# Patient Record
Sex: Female | Born: 1990 | Race: Black or African American | Hispanic: No | Marital: Single | Smoking: Former smoker
Health system: Southern US, Community
[De-identification: ages and names within clinical notes are randomized; demographics above are authoritative.]

## PROBLEM LIST (undated history)

## (undated) ENCOUNTER — Inpatient Hospital Stay (HOSPITAL_COMMUNITY): Payer: Self-pay

## (undated) DIAGNOSIS — O139 Gestational [pregnancy-induced] hypertension without significant proteinuria, unspecified trimester: Secondary | ICD-10-CM

## (undated) HISTORY — PX: NO PAST SURGERIES: SHX2092

---
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
## 2013-03-03 ENCOUNTER — Inpatient Hospital Stay (HOSPITAL_COMMUNITY)
Admission: AD | Admit: 2013-03-03 | Discharge: 2013-03-03 | Disposition: A | Discharge status: Home / Self Care | Payer: Self-pay | Source: Ambulatory Visit | Attending: Obstetrics & Gynecology | Admitting: Obstetrics & Gynecology

## 2013-03-03 ENCOUNTER — Encounter (HOSPITAL_COMMUNITY): Payer: Self-pay | Admitting: *Deleted

## 2013-03-03 DIAGNOSIS — O219 Vomiting of pregnancy, unspecified: Secondary | ICD-10-CM

## 2013-03-03 DIAGNOSIS — O210 Mild hyperemesis gravidarum: Secondary | ICD-10-CM | POA: Insufficient documentation

## 2013-03-03 LAB — URINALYSIS, ROUTINE W REFLEX MICROSCOPIC
Ketones, ur: 15 mg/dL — AB
Protein, ur: 100 mg/dL — AB
Urobilinogen, UA: 0.2 mg/dL (ref 0.0–1.0)

## 2013-03-03 LAB — POCT PREGNANCY, URINE: Preg Test, Ur: POSITIVE — AB

## 2013-03-03 LAB — URINE MICROSCOPIC-ADD ON

## 2013-03-03 MED ORDER — ONDANSETRON HCL 4 MG PO TABS: 4.0000 mg | ORAL_TABLET | Freq: Four times a day (QID) | ORAL | Status: DC

## 2013-03-03 MED ORDER — PRENATAL COMPLETE 14-0.4 MG PO TABS: 1.0000 | ORAL_TABLET | Freq: Every day | ORAL | Status: DC

## 2013-03-03 MED ORDER — ACETAMINOPHEN 500 MG PO TABS: 1000.0000 mg | ORAL_TABLET | Freq: Once | ORAL | Status: DC

## 2013-03-03 MED ORDER — PROMETHAZINE HCL 25 MG PO TABS: 25.0000 mg | ORAL_TABLET | Freq: Four times a day (QID) | ORAL | Status: DC | PRN

## 2013-03-03 MED ORDER — LACTATED RINGERS IV BOLUS (SEPSIS): 1000.0000 mL | Freq: Once | INTRAVENOUS | Status: DC

## 2013-03-03 NOTE — MAU Provider Note (Signed)
History     CSN: [id number]  Arrival date and time: 03/03/13 1055   First Provider Initiated Contact with Patient 03/03/13 1234      Chief Complaint  Patient presents with  . Fatigue  . Possible Pregnancy   HPI Ms. Kelsey Fitzgerald is a 22 y.o. G2P0 at [redacted]w[redacted]d who present to MAU today with complaint of N/V and possible pregnancy. The patient states that she had +HPT in early March. She has had N/V x 3 weeks. She denies abdominal pain, vaginal bleeding, abnormal discharge or fever. She has not started prenatal care or prenatal vitamins yet. She needs pregnancy confirmation for medicaid. LMP 01/09/13.   OB History   Grav Para Term Preterm Abortions TAB SAB Ect Mult Living   2      0         History reviewed. No pertinent past medical history.  History reviewed. No pertinent past surgical history.  History reviewed. No pertinent family history.  History  Substance Use Topics  . Smoking status: Current Every Day Smoker  . Smokeless tobacco: Not on file  . Alcohol Use: No    Allergies: No Known Allergies  No prescriptions prior to admission    Review of Systems  Constitutional: Negative for fever and malaise/fatigue.  Gastrointestinal: Positive for nausea and vomiting. Negative for abdominal pain, diarrhea and constipation.  Genitourinary: Negative for dysuria, urgency and frequency.       Neg - vaginal bleeding Neg - vaginal discharge  Neurological: Positive for headaches.   Physical Exam   Blood pressure 144/66, pulse 92, temperature 98.3 F (36.8 C), temperature source Oral, resp. rate 18, height 5\' 6"  (1.676 m), weight 128 lb (58.06 kg), last menstrual period 01/09/2013.  Physical Exam  Constitutional: She is oriented to person, place, and time. She appears well-developed and well-nourished. No distress.  HENT:  Head: Normocephalic and atraumatic.  Cardiovascular: Normal rate, regular rhythm and normal heart sounds.   Respiratory: Effort normal and breath sounds  normal. No respiratory distress.  GI: Soft. Bowel sounds are normal. She exhibits no distension and no mass. There is no tenderness. There is no rebound and no guarding.  Neurological: She is alert and oriented to person, place, and time.  Skin: Skin is warm and dry. No erythema.  Psychiatric: She has a normal mood and affect.   Results for orders placed during the hospital encounter of 03/03/13 (from the past 24 hour(s))  URINALYSIS, ROUTINE W REFLEX MICROSCOPIC     Status: Abnormal   Collection Time    03/03/13 11:30 AM      Result Value Range   Color, Urine YELLOW  YELLOW   APPearance CLOUDY (*) CLEAR   Specific Gravity, Urine >1.030 (*) 1.005 - 1.030   pH 6.0  5.0 - 8.0   Glucose, UA NEGATIVE  NEGATIVE mg/dL   Hgb urine dipstick NEGATIVE  NEGATIVE   Bilirubin Urine SMALL (*) NEGATIVE   Ketones, ur 15 (*) NEGATIVE mg/dL   Protein, ur 956 (*) NEGATIVE mg/dL   Urobilinogen, UA 0.2  0.0 - 1.0 mg/dL   Nitrite NEGATIVE  NEGATIVE   Leukocytes, UA TRACE (*) NEGATIVE  URINE MICROSCOPIC-ADD ON     Status: Abnormal   Collection Time    03/03/13 11:30 AM      Result Value Range   Squamous Epithelial / LPF MANY (*) RARE   WBC, UA 11-20  <3 WBC/hpf   Bacteria, UA FEW (*) RARE   Urine-Other MUCOUS PRESENT  POCT PREGNANCY, URINE     Status: Abnormal   Collection Time    03/03/13 11:34 AM      Result Value Range   Preg Test, Ur POSITIVE (*) NEGATIVE    MAU Course  Procedures None  MDM UA indicated dehydration. Patient is able to take in some PO. Offered IV fluids for re-hydration. Patient accepted, but then declined after unsuccessful IV stick. Patient is not currently nauseous or actively vomiting.   Assessment and Plan  A: Nausea and vomiting in pregnancy  P: Discharge home Rx for phenergan, Zofran and prenatal vitamins sent to patient's pharmacy Encouraged increased PO hydration as tolerated Patient given pregnancy confirmation letter and list of area OB  providers Patient may return to MAU as needed or if her condition were to change or worsen  Freddi Starr, PA-C  03/03/2013, 12:35 PM

## 2013-03-03 NOTE — MAU Note (Signed)
Pt presents for weakness that began this morning.  She also c/o vomiting x3weeks.  Took HPT at the beginning of March that was positive.

## 2013-03-03 NOTE — MAU Note (Signed)
Feeling real weak this morning. Has not beem seen anywhere yet with preg.  Vomiting. Sleepy.

## 2013-03-04 LAB — URINE CULTURE: Colony Count: 3000

## 2013-03-31 ENCOUNTER — Inpatient Hospital Stay (HOSPITAL_COMMUNITY)
Admission: AD | Admit: 2013-03-31 | Discharge: 2013-03-31 | Disposition: A | Discharge status: Home / Self Care | Payer: Self-pay | Source: Ambulatory Visit | Attending: Obstetrics & Gynecology | Admitting: Obstetrics & Gynecology

## 2013-03-31 ENCOUNTER — Encounter (HOSPITAL_COMMUNITY): Payer: Self-pay | Admitting: *Deleted

## 2013-03-31 DIAGNOSIS — N949 Unspecified condition associated with female genital organs and menstrual cycle: Secondary | ICD-10-CM | POA: Insufficient documentation

## 2013-03-31 DIAGNOSIS — O2390 Unspecified genitourinary tract infection in pregnancy, unspecified trimester: Secondary | ICD-10-CM

## 2013-03-31 DIAGNOSIS — O98819 Other maternal infectious and parasitic diseases complicating pregnancy, unspecified trimester: Secondary | ICD-10-CM | POA: Insufficient documentation

## 2013-03-31 DIAGNOSIS — A5901 Trichomonal vulvovaginitis: Secondary | ICD-10-CM | POA: Insufficient documentation

## 2013-03-31 LAB — WET PREP, GENITAL: Yeast Wet Prep HPF POC: NONE SEEN

## 2013-03-31 MED ORDER — ONDANSETRON 8 MG PO TBDP
8.0000 mg | ORAL_TABLET | Freq: Once | ORAL | Status: AC
  Administered 2013-03-31: 8 mg via ORAL
  Filled 2013-03-31: qty 1

## 2013-03-31 MED ORDER — METRONIDAZOLE 500 MG PO TABS
2000.0000 mg | ORAL_TABLET | Freq: Once | ORAL | Status: AC
  Administered 2013-03-31: 2000 mg via ORAL
  Filled 2013-03-31: qty 4

## 2013-03-31 NOTE — MAU Provider Note (Signed)
Attestation of Attending Supervision of Advanced Practitioner (PA/CNM/NP): Evaluation and management procedures were performed by the Advanced Practitioner under my supervision and collaboration.  I have reviewed the Advanced Practitioner's note and chart, and I agree with the management and plan.  Glorianne Proctor, MD, FACOG Attending Obstetrician & Gynecologist Faculty Practice, [facility]  

## 2013-03-31 NOTE — MAU Provider Note (Signed)
History     CSN: [id number]  Arrival date and time: 03/31/13 1316   First Provider Initiated Contact with Patient 03/31/13 1443      Chief Complaint  Patient presents with  . Vaginal Discharge   HPI Kelsey Fitzgerald is 22 y.o. G2P0010 [redacted]w[redacted]d weeks presenting with yellowish vaginal discharge with odor X 3 weeks. She says she is using a mini pad because of the amount of discharge.  She has not begun prenatal care -- waiting for insurance.    Past Medical History  Diagnosis Date  . Medical history non-contributory     Past Surgical History  Procedure Laterality Date  . No past surgeries      Family History  Problem Relation Age of Onset  . Alcohol abuse Neg Hx   . Arthritis Neg Hx   . Asthma Neg Hx   . Birth defects Neg Hx   . Cancer Neg Hx   . COPD Neg Hx   . Depression Neg Hx   . Diabetes Neg Hx   . Drug abuse Neg Hx   . Early death Neg Hx   . Hearing loss Neg Hx   . Heart disease Neg Hx   . Hyperlipidemia Neg Hx   . Hypertension Neg Hx   . Kidney disease Neg Hx   . Learning disabilities Neg Hx   . Mental illness Neg Hx   . Mental retardation Neg Hx   . Miscarriages / Stillbirths Neg Hx   . Stroke Neg Hx   . Vision loss Neg Hx     History  Substance Use Topics  . Smoking status: Former Games developer  . Smokeless tobacco: Not on file  . Alcohol Use: No    Allergies: No Known Allergies  No prescriptions prior to admission    Review of Systems  Constitutional: Negative.   Gastrointestinal: Negative for nausea, vomiting and abdominal pain.  Genitourinary: Negative for dysuria, urgency and frequency.       + for large amount of malodorous discharge  Neurological: Negative for headaches.   Physical Exam   Blood pressure 118/73, pulse 70, temperature 98.3 F (36.8 C), temperature source Oral, resp. rate 16, height 5\' 7"  (1.702 m), weight 128 lb (58.06 kg), last menstrual period 01/09/2013.  Physical Exam  Constitutional: She is oriented to person, place,  and time. She appears well-developed and well-nourished. No distress.  HENT:  Head: Normocephalic.  Neck: Normal range of motion.  Cardiovascular: Normal rate.   Respiratory: Effort normal.  GI: Soft. She exhibits no distension and no mass. There is no tenderness. There is no rebound and no guarding.  Genitourinary: There is no rash, tenderness or lesion on the right labia. There is no rash, tenderness or lesion on the left labia. Uterus is enlarged (1 cm below symphysis). Uterus is not tender. Cervix exhibits friability. Cervix exhibits no motion tenderness and no discharge. There is erythema around the vagina. No tenderness or bleeding around the vagina. Vaginal discharge (large amount of yellow discharge with odor) found.  FHR by doppler 156  Neurological: She is alert and oriented to person, place, and time.  Skin: Skin is warm and dry.  Psychiatric: She has a normal mood and affect. Her behavior is normal.   Results for orders placed during the hospital encounter of 03/31/13 (from the past 24 hour(s))  WET PREP, GENITAL     Status: Abnormal   Collection Time    03/31/13  2:45 PM      Result Value  Range   Yeast Wet Prep HPF POC NONE SEEN  NONE SEEN   Trich, Wet Prep FEW (*) NONE SEEN   Clue Cells Wet Prep HPF POC FEW (*) NONE SEEN   WBC, Wet Prep HPF POC TOO NUMEROUS TO COUNT (*) NONE SEEN   MAU Course  Procedures  GC/CHL culture to lab  MDM Discussed lab results with the patient and the option of 1 time treatment with Flagyl 2grams vs 1 tab bid X 1 week.  She would like to treat it with one dose.  Will give Zofran, crackers before Flagyl.  Patient took Zofran, tolerated crackers, juice and the Flagyl.  Ready for discharge  Assessment and Plan  A:  Vaginal discharge      Trichomonal infection       [redacted]w[redacted]d gestation with FHR 156 by doppler  P:  Flagyl given in MAU      Patient to begin prenatal care with doctor of her choice      Condoms for intercourse    KEY,EVE  M 03/31/2013, 2:44 PM

## 2013-03-31 NOTE — MAU Note (Signed)
Getting real uncomfortable with the discharge.  Has 'gotten rid of the smell" 3 times, has been douching.

## 2013-04-01 LAB — GC/CHLAMYDIA PROBE AMP: CT Probe RNA: NEGATIVE

## 2013-12-30 ENCOUNTER — Encounter (HOSPITAL_COMMUNITY): Payer: Self-pay | Admitting: Emergency Medicine

## 2013-12-30 ENCOUNTER — Emergency Department (HOSPITAL_COMMUNITY)
Admission: EM | Admit: 2013-12-30 | Discharge: 2013-12-31 | Disposition: A | Discharge status: Home / Self Care | Payer: Medicaid Other | Attending: Emergency Medicine | Admitting: Emergency Medicine

## 2013-12-30 DIAGNOSIS — Z87891 Personal history of nicotine dependence: Secondary | ICD-10-CM | POA: Insufficient documentation

## 2013-12-30 DIAGNOSIS — L02214 Cutaneous abscess of groin: Secondary | ICD-10-CM

## 2013-12-30 DIAGNOSIS — L03319 Cellulitis of trunk, unspecified: Principal | ICD-10-CM

## 2013-12-30 DIAGNOSIS — Z792 Long term (current) use of antibiotics: Secondary | ICD-10-CM | POA: Insufficient documentation

## 2013-12-30 DIAGNOSIS — L02219 Cutaneous abscess of trunk, unspecified: Secondary | ICD-10-CM | POA: Insufficient documentation

## 2013-12-30 NOTE — ED Provider Notes (Signed)
CSN: [id number]     Arrival date & time 12/30/13  2158 History   First MD Initiated Contact with Patient 12/30/13 2314     Chief Complaint  Patient presents with  . boil    (Consider location/radiation/quality/duration/timing/severity/associated sxs/prior Treatment) HPI Comments: Patient is a 23 y/o female who presents for an abscess to her R groin region x 5 days. Symptoms gradually worsening since onset. Patient has tried warm compresses, topical alcohol swabs, and topical creams without relief. Patient states pain is sharp, throbbing, and aching. It has been constant and is nonradiating. Patient states abscess began draining a few days ago. She denies fever, abdominal pain, vomiting, urinary symptoms, vaginal bleeding/discharge, red linear streaking, rectal pain, pain with defecation, numbness/tingling, and weakness.  The history is provided by the patient. No language interpreter was used.    Past Medical History  Diagnosis Date  . Medical history non-contributory    Past Surgical History  Procedure Laterality Date  . No past surgeries     Family History  Problem Relation Age of Onset  . Alcohol abuse Neg Hx   . Arthritis Neg Hx   . Asthma Neg Hx   . Birth defects Neg Hx   . Cancer Neg Hx   . COPD Neg Hx   . Depression Neg Hx   . Diabetes Neg Hx   . Drug abuse Neg Hx   . Early death Neg Hx   . Hearing loss Neg Hx   . Heart disease Neg Hx   . Hyperlipidemia Neg Hx   . Hypertension Neg Hx   . Kidney disease Neg Hx   . Learning disabilities Neg Hx   . Mental illness Neg Hx   . Mental retardation Neg Hx   . Miscarriages / Stillbirths Neg Hx   . Stroke Neg Hx   . Vision loss Neg Hx    History  Substance Use Topics  . Smoking status: Former Games developermoker  . Smokeless tobacco: Not on file  . Alcohol Use: No   OB History   Grav Para Term Preterm Abortions TAB SAB Ect Mult Living   2    1  1         Review of Systems  Constitutional: Negative for fever.  Gastrointestinal:  Negative for nausea and vomiting.  Skin: Positive for color change. Negative for pallor.  Neurological: Negative for weakness and numbness.  All other systems reviewed and are negative.    Allergies  Ibuprofen  Home Medications   Current Outpatient Rx  Name  Route  Sig  Dispense  Refill  . acetaminophen-codeine (TYLENOL #3) 300-30 MG per tablet   Oral   Take 1 tablet by mouth daily as needed for moderate pain.          . medroxyPROGESTERone (DEPO-PROVERA) 150 MG/ML injection   Intramuscular   Inject 150 mg into the muscle every 3 (three) months. Last injection December 2014         . sulfamethoxazole-trimethoprim (BACTRIM DS,SEPTRA DS) 800-160 MG per tablet   Oral   Take 1 tablet by mouth 2 (two) times daily.   14 tablet   0    BP 132/91  Pulse 100  Temp(Src) 98.3 F (36.8 C)  Resp 18  Ht 5\' 8"  (1.727 m)  Wt 135 lb (61.236 kg)  BMI 20.53 kg/m2  SpO2 99%  LMP 12/30/2013  Breastfeeding? Unknown  Physical Exam  Nursing note and vitals reviewed. Constitutional: She is oriented to person, place, and time. She  appears well-developed and well-nourished. No distress.  HENT:  Head: Normocephalic and atraumatic.  Eyes: Conjunctivae and EOM are normal. No scleral icterus.  Neck: Normal range of motion.  Pulmonary/Chest: Effort normal. No respiratory distress.  Abdominal: Hernia confirmed negative in the right inguinal area and confirmed negative in the left inguinal area.  No TTP, peritoneal signs, or guarding.  Genitourinary: Vagina normal.    There is tenderness on the right labia. There is no lesion or injury on the right labia. There is no rash, tenderness, lesion or injury on the left labia.  +abscess with TTP extending to R labia. No vaginal induration or red linear streaking. No peritoneal induration or TTP.  Musculoskeletal: Normal range of motion.  Lymphadenopathy:       Right: No inguinal adenopathy present.       Left: No inguinal adenopathy present.    Neurological: She is alert and oriented to person, place, and time.  No gross sensory deficits appreciated. Patient moves extremities without ataxia.  Skin: Skin is warm and dry. No rash noted. She is not diaphoretic. No pallor.  Psychiatric: She has a normal mood and affect. Her behavior is normal.    ED Course  Procedures (including critical care time) Labs Review Labs Reviewed - No data to display Imaging Review No results found.  EKG Interpretation   None      INCISION AND DRAINAGE Performed by: Antony Madura Consent: Verbal consent obtained. Risks and benefits: risks, benefits and alternatives were discussed Type: abscess  Body area: R inguinal  Anesthesia: local infiltration  Incision was made with a scalpel.  Local anesthetic: lidocaine 2% with epinephrine  Anesthetic total: 8 ml  Complexity: complex Blunt dissection to break up loculations  Drainage: purulent  Drainage amount: copious  Packing material: 1/4 in iodoform gauze  Patient tolerance: Patient tolerated the procedure well with no immediate complications.   MDM   1. Soft tissue abscess of inguinal region    Uncomplicated abscess. Patient well and nontoxic appearing, hemodynamically stable, and afebrile. She is neurovascularly intact in physical exam. Abdomen without peritoneal signs or tenderness. No red linear streaking. Abscess I&D'd at bedside with copious amounts of purulent drainage. Patient tolerated well. She is stable for discharge with instruction to return in 48 hours for packing removal and recheck. Have advised warm compresses and sitz baths to promote further drainage. Return precautions discussed and patient agreeable to plan with no unaddressed concerns.    Antony Madura, PA-C 12/31/13 [phone]

## 2013-12-30 NOTE — ED Notes (Signed)
The pt has a boil on her rt thigh upper for one week

## 2013-12-31 MED ORDER — SULFAMETHOXAZOLE-TRIMETHOPRIM 800-160 MG PO TABS: 1.0000 | ORAL_TABLET | Freq: Two times a day (BID) | ORAL | Status: DC

## 2013-12-31 NOTE — ED Notes (Signed)
Signature pad not working at bedside.  Pt verbalized understanding of d/c and f/u.  No additional questions by pt regarding d/c. VSS.

## 2013-12-31 NOTE — Discharge Instructions (Signed)
Recommend warm soaks 3-4 times per day to promote drainage. Take 600 mg ibuprofen every 6 hours as needed for pain control. Keep the area clean and dry and do not change initial dressing for at least 12 hours. Return to the emergency department in 48 hours for a recheck and packing removal. Take the antibiotics prescribed to you for the full course. Return sooner if symptoms worsen.  Abscess Care After An abscess (also called a boil or furuncle) is an infected area that contains a collection of pus. Signs and symptoms of an abscess include pain, tenderness, redness, or hardness, or you may feel a moveable soft area under your skin. An abscess can occur anywhere in the body. The infection may spread to surrounding tissues causing cellulitis. A cut (incision) by the surgeon was made over your abscess and the pus was drained out. Gauze may have been packed into the space to provide a drain that will allow the cavity to heal from the inside outwards. The boil may be painful for 5 to 7 days. Most people with a boil do not have high fevers. Your abscess, if seen early, may not have localized, and may not have been lanced. If not, another appointment may be required for this if it does not get better on its own or with medications. HOME CARE INSTRUCTIONS   Only take over-the-counter or prescription medicines for pain, discomfort, or fever as directed by your caregiver.  When you bathe, soak and then remove gauze or iodoform packs at least daily or as directed by your caregiver. You may then wash the wound gently with mild soapy water. Repack with gauze or do as your caregiver directs. SEEK IMMEDIATE MEDICAL CARE IF:   You develop increased pain, swelling, redness, drainage, or bleeding in the wound site.  You develop signs of generalized infection including muscle aches, chills, fever, or a general ill feeling.  An oral temperature above 102 F (38.9 C) develops, not controlled by medication. See your  caregiver for a recheck if you develop any of the symptoms described above. If medications (antibiotics) were prescribed, take them as directed. Document Released: 06/19/2005 Document Revised: 02/23/2012 Document Reviewed: 02/14/2008 [facility] Patient Information 2014 [place], [place].

## 2014-01-01 NOTE — ED Provider Notes (Signed)
Medical screening examination/treatment/procedure(s) were performed by non-physician practitioner and as supervising physician I was immediately available for consultation/collaboration.  EKG Interpretation   None         Brandt LoosenJulie Manly, MD 01/01/14 [phone]

## 2014-01-02 ENCOUNTER — Emergency Department (HOSPITAL_COMMUNITY)
Admission: EM | Admit: 2014-01-02 | Discharge: 2014-01-02 | Disposition: A | Discharge status: Home / Self Care | Payer: Medicaid Other | Attending: Emergency Medicine | Admitting: Emergency Medicine

## 2014-01-02 ENCOUNTER — Encounter (HOSPITAL_COMMUNITY): Payer: Self-pay | Admitting: Emergency Medicine

## 2014-01-02 DIAGNOSIS — Z4801 Encounter for change or removal of surgical wound dressing: Secondary | ICD-10-CM | POA: Insufficient documentation

## 2014-01-02 DIAGNOSIS — Z87891 Personal history of nicotine dependence: Secondary | ICD-10-CM | POA: Insufficient documentation

## 2014-01-02 DIAGNOSIS — Z5189 Encounter for other specified aftercare: Secondary | ICD-10-CM

## 2014-01-02 DIAGNOSIS — Z888 Allergy status to other drugs, medicaments and biological substances status: Secondary | ICD-10-CM | POA: Insufficient documentation

## 2014-01-02 NOTE — ED Notes (Signed)
Patient had abscess I & D'd a few days ago in R groin area.   Back to have rechecked.

## 2014-01-02 NOTE — Discharge Instructions (Signed)
Wound may continue to drain for the next day or so-- this is normal. Return to the ED for new or worsening symptoms including redness, increased swelling, high fevers, new abscesses, etc.

## 2014-01-02 NOTE — ED Notes (Signed)
Pt is here to have packing removed from wound in right groin.

## 2014-01-02 NOTE — ED Provider Notes (Signed)
CSN: [id number]     Arrival date & time 01/02/14  1200 History  This chart was scribed for non-physician practitioner, ,working with Junius Argyle, MD, by Karle Plumber, ED Scribe.  This patient was seen in room TR09C/TR09C and the patient's care was started at 12:25 PM.  Chief Complaint  Patient presents with  . Wound Check   The history is provided by the patient. No language interpreter was used.   HPI Comments:  Kelsey Fitzgerald is a 23 y.o. female who presents to the Emergency Department needing packing removed from an abscess of her right groin that was drained here in the ED two days ago. Pt states she is not having any issues and reports her pain is minimal at this time. She denies any fever.  No new abscesses have developed.  Past Medical History  Diagnosis Date  . Medical history non-contributory    Past Surgical History  Procedure Laterality Date  . No past surgeries     Family History  Problem Relation Age of Onset  . Alcohol abuse Neg Hx   . Arthritis Neg Hx   . Asthma Neg Hx   . Birth defects Neg Hx   . Cancer Neg Hx   . COPD Neg Hx   . Depression Neg Hx   . Diabetes Neg Hx   . Drug abuse Neg Hx   . Early death Neg Hx   . Hearing loss Neg Hx   . Heart disease Neg Hx   . Hyperlipidemia Neg Hx   . Hypertension Neg Hx   . Kidney disease Neg Hx   . Learning disabilities Neg Hx   . Mental illness Neg Hx   . Mental retardation Neg Hx   . Miscarriages / Stillbirths Neg Hx   . Stroke Neg Hx   . Vision loss Neg Hx    History  Substance Use Topics  . Smoking status: Former Games developer  . Smokeless tobacco: Not on file  . Alcohol Use: No   OB History   Grav Para Term Preterm Abortions TAB SAB Ect Mult Living   2    1  1         Review of Systems  Constitutional: Negative for fever.  Skin: Positive for wound (packed wound in right groin).  All other systems reviewed and are negative.    Allergies  Ibuprofen  Home Medications   Current Outpatient Rx   Name  Route  Sig  Dispense  Refill  . acetaminophen-codeine (TYLENOL #3) 300-30 MG per tablet   Oral   Take 1 tablet by mouth daily as needed for moderate pain.          . medroxyPROGESTERone (DEPO-PROVERA) 150 MG/ML injection   Intramuscular   Inject 150 mg into the muscle every 3 (three) months. Last injection December 2014         . sulfamethoxazole-trimethoprim (BACTRIM DS,SEPTRA DS) 800-160 MG per tablet   Oral   Take 1 tablet by mouth 2 (two) times daily.   14 tablet   0    Triage Vitals: BP 151/70  Pulse 65  Temp(Src) 98.3 F (36.8 C) (Oral)  Resp 18  Wt 135 lb (61.236 kg)  SpO2 98%  LMP 12/30/2013  Physical Exam  Nursing note and vitals reviewed. Constitutional: She is oriented to person, place, and time. She appears well-developed and well-nourished. No distress.  HENT:  Head: Normocephalic and atraumatic.  Mouth/Throat: Oropharynx is clear and moist.  Eyes: Conjunctivae and EOM are  normal. Pupils are equal, round, and reactive to light.  Neck: Normal range of motion.  Cardiovascular: Normal rate, regular rhythm and normal heart sounds.   Pulmonary/Chest: Effort normal and breath sounds normal. No respiratory distress. She has no wheezes.  Genitourinary:     Right labial abscess with packing in place; purulent drainage noted to packing; no surrounding swelling, erythema, or signs of cellulitis; minimal pain with palpation  Musculoskeletal: Normal range of motion.  Neurological: She is alert and oriented to person, place, and time.  Skin: Skin is warm and dry. She is not diaphoretic.  Psychiatric: She has a normal mood and affect.    ED Course  Procedures (including critical care time) DIAGNOSTIC STUDIES: Oxygen Saturation is 98% on RA, normal by my interpretation.   COORDINATION OF CARE: 12:27 PM- Advised pt on signs of infection and to return for worsening symptoms. Pt verbalizes understanding and agrees to plan.  Medications - No data to  display  Labs Review Labs Reviewed - No data to display Imaging Review No results found.  EKG Interpretation   None       MDM   1. Wound check, abscess    Wound healing well without signs of cellulitis and pt has minimal pain at this time.  Packing removed without difficulty and wound was redressed.  Pt afebrile, non-toxic appearing, NAD, VS stable- ok for discharge.  Instructed to monitor for worsening infection-- redness, swelling, high fevers, etc.  Discussed plan with pt, they agreed.  Return precautions advised.  I personally performed the services described in this documentation, which was scribed in my presence. The recorded information has been reviewed and is accurate.  Garlon HatchetLisa M Anikah Hogge, PA-C 01/02/14 1345

## 2014-01-03 NOTE — ED Provider Notes (Signed)
Medical screening examination/treatment/procedure(s) were performed by non-physician practitioner and as supervising physician I was immediately available for consultation/collaboration.  EKG Interpretation   None         Megyn Leng S Rykar Lebleu, MD 01/03/14 1021 

## 2014-05-12 ENCOUNTER — Emergency Department (HOSPITAL_COMMUNITY): Payer: Medicaid Other

## 2014-05-12 ENCOUNTER — Emergency Department (HOSPITAL_COMMUNITY)
Admission: EM | Admit: 2014-05-12 | Discharge: 2014-05-12 | Disposition: A | Discharge status: Home / Self Care | Payer: Medicaid Other | Attending: Emergency Medicine | Admitting: Emergency Medicine

## 2014-05-12 ENCOUNTER — Encounter (HOSPITAL_COMMUNITY): Payer: Self-pay | Admitting: Emergency Medicine

## 2014-05-12 DIAGNOSIS — Z79899 Other long term (current) drug therapy: Secondary | ICD-10-CM | POA: Insufficient documentation

## 2014-05-12 DIAGNOSIS — Y9389 Activity, other specified: Secondary | ICD-10-CM | POA: Diagnosis not present

## 2014-05-12 DIAGNOSIS — Y92410 Unspecified street and highway as the place of occurrence of the external cause: Secondary | ICD-10-CM | POA: Diagnosis not present

## 2014-05-12 DIAGNOSIS — S0993XA Unspecified injury of face, initial encounter: Secondary | ICD-10-CM | POA: Insufficient documentation

## 2014-05-12 DIAGNOSIS — S46909A Unspecified injury of unspecified muscle, fascia and tendon at shoulder and upper arm level, unspecified arm, initial encounter: Secondary | ICD-10-CM | POA: Diagnosis present

## 2014-05-12 DIAGNOSIS — Z87891 Personal history of nicotine dependence: Secondary | ICD-10-CM | POA: Diagnosis not present

## 2014-05-12 DIAGNOSIS — S199XXA Unspecified injury of neck, initial encounter: Secondary | ICD-10-CM

## 2014-05-12 DIAGNOSIS — S161XXA Strain of muscle, fascia and tendon at neck level, initial encounter: Secondary | ICD-10-CM

## 2014-05-12 DIAGNOSIS — S4980XA Other specified injuries of shoulder and upper arm, unspecified arm, initial encounter: Secondary | ICD-10-CM | POA: Diagnosis present

## 2014-05-12 DIAGNOSIS — S139XXA Sprain of joints and ligaments of unspecified parts of neck, initial encounter: Secondary | ICD-10-CM | POA: Insufficient documentation

## 2014-05-12 DIAGNOSIS — Z3202 Encounter for pregnancy test, result negative: Secondary | ICD-10-CM | POA: Diagnosis not present

## 2014-05-12 LAB — POC URINE PREG, ED: Preg Test, Ur: NEGATIVE

## 2014-05-12 MED ORDER — CYCLOBENZAPRINE HCL 10 MG PO TABS: 10.0000 mg | ORAL_TABLET | Freq: Three times a day (TID) | ORAL | Status: DC | PRN

## 2014-05-12 MED ORDER — TRAMADOL HCL 50 MG PO TABS: 50.0000 mg | ORAL_TABLET | Freq: Four times a day (QID) | ORAL | Status: DC | PRN
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
## 2014-05-12 NOTE — Discharge Instructions (Signed)
Cervical Sprain °A cervical sprain is when the tissues (ligaments) that hold the neck bones in place stretch or tear. °HOME CARE  °· Put ice on the injured area. °· Put ice in a plastic bag. °· Place a towel between your skin and the bag. °· Leave the ice on for 15 20 minutes, 3 4 times a day. °· You may have been given a collar to wear. This collar keeps your neck from moving while you heal. °· Do not take the collar off unless told by your doctor. °· If you have long hair, keep it outside of the collar. °· Ask your doctor before changing the position of your collar. You may need to change its position over time to make it more comfortable. °· If you are allowed to take off the collar for cleaning or bathing, follow your doctor's instructions on how to do it safely. °· Keep your collar clean by wiping it with mild soap and water. Dry it completely. If the collar has removable pads, remove them every 1 2 days to hand wash them with soap and water. Allow them to air dry. They should be dry before you wear them in the collar. °· Do not drive while wearing the collar. °· Only take medicine as told by your doctor. °· Keep all doctor visits as told. °· Keep all physical therapy visits as told. °· Adjust your work station so that you have good posture while you work. °· Avoid positions and activities that make your problems worse. °· Warm up and stretch before being active. °GET HELP IF: °· Your pain is not controlled with medicine. °· You cannot take less pain medicine over time as planned. °· Your activity level does not improve as expected. °GET HELP RIGHT AWAY IF:  °· You are bleeding. °· Your stomach is upset. °· You have an allergic reaction to your medicine. °· You develop new problems that you cannot explain. °· You lose feeling (become numb) or you cannot move any part of your body (paralysis). °· You have tingling or weakness in any part of your body. °· Your symptoms get worse. Symptoms include: °· Pain,  soreness, stiffness, puffiness (swelling), or a burning feeling in your neck. °· Pain when your neck is touched. °· Shoulder or upper back pain. °· Limited ability to move your neck. °· Headache. °· Dizziness. °· Your hands or arms feel week, lose feeling, or tingle. °· Muscle spasms. °· Difficulty swallowing or chewing. °MAKE SURE YOU:  °· Understand these instructions. °· Will watch your condition. °· Will get help right away if you are not doing well or get worse. °Document Released: 05/19/2008 Document Revised: 08/03/2013 Document Reviewed: 06/08/2013 °ExitCare® Patient Information ©2014 [employer]. ° °
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
## 2014-05-15 NOTE — ED Provider Notes (Signed)
Medical screening examination/treatment/procedure(s) were performed by non-physician practitioner and as supervising physician I was immediately available for consultation/collaboration.   EKG Interpretation None      Lonna Rabold, MD, FACEP   Daryle Boyington L Cinda Hara, MD 05/15/14 1452 

## 2014-10-16 ENCOUNTER — Encounter (HOSPITAL_COMMUNITY): Payer: Self-pay | Admitting: Emergency Medicine

## 2015-03-15 ENCOUNTER — Encounter (HOSPITAL_COMMUNITY): Payer: Self-pay | Admitting: Family Medicine

## 2015-03-15 ENCOUNTER — Emergency Department (HOSPITAL_COMMUNITY)
Admission: EM | Admit: 2015-03-15 | Discharge: 2015-03-15 | Disposition: A | Discharge status: Home / Self Care | Payer: Medicaid Other | Attending: Emergency Medicine | Admitting: Emergency Medicine

## 2015-03-15 ENCOUNTER — Emergency Department (HOSPITAL_COMMUNITY): Payer: Medicaid Other

## 2015-03-15 DIAGNOSIS — N939 Abnormal uterine and vaginal bleeding, unspecified: Secondary | ICD-10-CM

## 2015-03-15 DIAGNOSIS — Z79899 Other long term (current) drug therapy: Secondary | ICD-10-CM | POA: Insufficient documentation

## 2015-03-15 DIAGNOSIS — R102 Pelvic and perineal pain: Secondary | ICD-10-CM

## 2015-03-15 DIAGNOSIS — A5901 Trichomonal vulvovaginitis: Secondary | ICD-10-CM

## 2015-03-15 DIAGNOSIS — Z87891 Personal history of nicotine dependence: Secondary | ICD-10-CM | POA: Insufficient documentation

## 2015-03-15 DIAGNOSIS — N8320 Unspecified ovarian cysts: Secondary | ICD-10-CM | POA: Insufficient documentation

## 2015-03-15 DIAGNOSIS — Z3202 Encounter for pregnancy test, result negative: Secondary | ICD-10-CM | POA: Insufficient documentation

## 2015-03-15 DIAGNOSIS — N83201 Unspecified ovarian cyst, right side: Secondary | ICD-10-CM

## 2015-03-15 LAB — WET PREP, GENITAL
CLUE CELLS WET PREP: NONE SEEN
Yeast Wet Prep HPF POC: NONE SEEN

## 2015-03-15 LAB — POC URINE PREG, ED: Preg Test, Ur: NEGATIVE

## 2015-03-15 MED ORDER — METRONIDAZOLE 500 MG PO TABS: 500.0000 mg | ORAL_TABLET | Freq: Two times a day (BID) | ORAL | Status: DC

## 2015-03-15 MED ORDER — HYDROCODONE-ACETAMINOPHEN 5-325 MG PO TABS: 2.0000 | ORAL_TABLET | ORAL | Status: DC | PRN

## 2015-03-15 NOTE — ED Notes (Signed)
Pt comfortable with discharge and follow up instructions. Prescriptions x2. 

## 2015-03-15 NOTE — ED Notes (Signed)
Pt presents from home via POV with c/o abnormal vaginal bleeding. Pt states her period ended last Tuesday and began bleeding again on Saturday.  Pt states she has had pelvic pressure made worse with tampon insertion and sitting/lying in certain positions.  States bleeding is not accompanied by any urinary symptoms, abnormal discharge or itching.

## 2015-03-15 NOTE — ED Provider Notes (Signed)
CSN: [id number]     Arrival date & time 03/15/15  7829 History   First MD Initiated Contact with Patient 03/15/15 [phone]     No chief complaint on file.    (Consider location/radiation/quality/duration/timing/severity/associated sxs/prior Treatment) Patient is a 24 y.o. female presenting with vaginal discharge. The history is provided by the patient. No language interpreter was used.  Vaginal Discharge Quality:  Bloody Severity:  Moderate Onset quality:  Gradual Timing:  Constant Progression:  Worsening Chronicity:  New Relieved by:  Nothing Worsened by:  Nothing tried Ineffective treatments:  None tried Associated symptoms: abdominal pain and nausea   Risk factors: no PID and no STI     Past Medical History  Diagnosis Date  . Medical history non-contributory    Past Surgical History  Procedure Laterality Date  . No past surgeries     Family History  Problem Relation Age of Onset  . Alcohol abuse Neg Hx   . Arthritis Neg Hx   . Asthma Neg Hx   . Birth defects Neg Hx   . Cancer Neg Hx   . COPD Neg Hx   . Depression Neg Hx   . Diabetes Neg Hx   . Drug abuse Neg Hx   . Early death Neg Hx   . Hearing loss Neg Hx   . Heart disease Neg Hx   . Hyperlipidemia Neg Hx   . Hypertension Neg Hx   . Kidney disease Neg Hx   . Learning disabilities Neg Hx   . Mental illness Neg Hx   . Mental retardation Neg Hx   . Miscarriages / Stillbirths Neg Hx   . Stroke Neg Hx   . Vision loss Neg Hx    History  Substance Use Topics  . Smoking status: Former Games developer  . Smokeless tobacco: Not on file  . Alcohol Use: No   OB History    Gravida Para Term Preterm AB TAB SAB Ectopic Multiple Living   Review of Systems  Gastrointestinal: Positive for nausea and abdominal pain.  Genitourinary: Positive for vaginal discharge.  All other systems reviewed and are negative.     Allergies  Ibuprofen  Home Medications   Prior to Admission medications   Medication  Sig Start Date End Date Taking? Authorizing Provider  cyclobenzaprine (FLEXERIL) 10 MG tablet Take 1 tablet (10 mg total) by mouth 3 (three) times daily as needed. 05/12/14   Tammi Triplett, PA-C  medroxyPROGESTERone (DEPO-PROVERA) 150 MG/ML injection Inject 150 mg into the muscle every 3 (three) months. Last injection December 2014    Historical Provider, MD  Pseudoeph-Doxylamine-DM-APAP (DAYQUIL/NYQUIL COLD/FLU RELIEF PO) Take 2 capsules by mouth 2 (two) times daily as needed (cold and flu symptoms).    Historical Provider, MD  traMADol (ULTRAM) 50 MG tablet Take 1 tablet (50 mg total) by mouth every 6 (six) hours as needed. 05/12/14   Tammi Triplett, PA-C   There were no vitals taken for this visit. Physical Exam  Constitutional: She appears well-developed and well-nourished.  HENT:  Head: Normocephalic and atraumatic.  Right Ear: External ear normal.  Left Ear: External ear normal.  Eyes: Conjunctivae are normal. Pupils are equal, round, and reactive to light.  Neck: Normal range of motion.  Cardiovascular: Normal rate and normal heart sounds.   Pulmonary/Chest: Effort normal.  Abdominal: Soft.  Genitourinary: Uterus normal.  Vaginal bleeding,  Palpable ovarain mass  Musculoskeletal: Normal range of motion.  Neurological: She is alert.  Skin: Skin is warm.  Psychiatric: She has a normal mood and affect.  Nursing note and vitals reviewed.   ED Course  Procedures (including critical care time) Labs Review Labs Reviewed - No data to display  Imaging Review No results found.   EKG Interpretation None     Pt counseled on ovarain cyst and trichomonas.   Pt is advised of need for followup MDM  [facility]  [facility]   Final diagnoses:  Pelvic pain in female  Cyst of right ovary  Trichomonas vaginitis    Ovarian cyst AVS   Elson AreasLeslie K Shekela Goodridge, PA-C 03/15/15 1435  Azalia BilisKevin Campos,  MD 03/15/15 1450

## 2015-03-15 NOTE — Discharge Instructions (Signed)
Ovarian Cyst An ovarian cyst is a fluid-filled sac that forms on an ovary. The ovaries are small organs that produce eggs in women. Various types of cysts can form on the ovaries. Most are not cancerous. Many do not cause problems, and they often go away on their own. Some may cause symptoms and require treatment. Common types of ovarian cysts include:  Functional cysts--These cysts may occur every month during the menstrual cycle. This is normal. The cysts usually go away with the next menstrual cycle if the woman does not get pregnant. Usually, there are no symptoms with a functional cyst.  Endometrioma cysts--These cysts form from the tissue that lines the uterus. They are also called "chocolate cysts" because they become filled with blood that turns brown. This type of cyst can cause pain in the lower abdomen during intercourse and with your menstrual period.  Cystadenoma cysts--This type develops from the cells on the outside of the ovary. These cysts can get very big and cause lower abdomen pain and pain with intercourse. This type of cyst can twist on itself, cut off its blood supply, and cause severe pain. It can also easily rupture and cause a lot of pain.  Dermoid cysts--This type of cyst is sometimes found in both ovaries. These cysts may contain different kinds of body tissue, such as skin, teeth, hair, or cartilage. They usually do not cause symptoms unless they get very big.  Theca lutein cysts--These cysts occur when too much of a certain hormone (human chorionic gonadotropin) is produced and overstimulates the ovaries to produce an egg. This is most common after procedures used to assist with the conception of a baby (in vitro fertilization). CAUSES   Fertility drugs can cause a condition in which multiple large cysts are formed on the ovaries. This is called ovarian hyperstimulation syndrome.  A condition called polycystic ovary syndrome can cause hormonal imbalances that can lead to  nonfunctional ovarian cysts. SIGNS AND SYMPTOMS  Many ovarian cysts do not cause symptoms. If symptoms are present, they may include:  Pelvic pain or pressure.  Pain in the lower abdomen.  Pain during sexual intercourse.  Increasing girth (swelling) of the abdomen.  Abnormal menstrual periods.  Increasing pain with menstrual periods.  Stopping having menstrual periods without being pregnant. DIAGNOSIS  These cysts are commonly found during a routine or annual pelvic exam. Tests may be ordered to find out more about the cyst. These tests may include:  Ultrasound.  X-ray of the pelvis.  CT scan.  MRI.  Blood tests. TREATMENT  Many ovarian cysts go away on their own without treatment. Your health care provider may want to check your cyst regularly for 2-3 months to see if it changes. For women in menopause, it is particularly important to monitor a cyst closely because of the higher rate of ovarian cancer in menopausal women. When treatment is needed, it may include any of the following:  A procedure to drain the cyst (aspiration). This may be done using a long needle and ultrasound. It can also be done through a laparoscopic procedure. This involves using a thin, lighted tube with a tiny camera on the end (laparoscope) inserted through a small incision.  Surgery to remove the whole cyst. This may be done using laparoscopic surgery or an open surgery involving a larger incision in the lower abdomen.  Hormone treatment or birth control pills. These methods are sometimes used to help dissolve a cyst. HOME CARE INSTRUCTIONS   Only take over-the-counter  or prescription medicines as directed by your health care provider.  Follow up with your health care provider as directed.  Get regular pelvic exams and Pap tests. SEEK MEDICAL CARE IF:   Your periods are late, irregular, or painful, or they stop.  Your pelvic pain or abdominal pain does not go away.  Your abdomen becomes  larger or swollen.  You have pressure on your bladder or trouble emptying your bladder completely.  You have pain during sexual intercourse.  You have feelings of fullness, pressure, or discomfort in your stomach.  You lose weight for no apparent reason.  You feel generally ill.  You become constipated.  You lose your appetite.  You develop acne.  You have an increase in body and facial hair.  You are gaining weight, without changing your exercise and eating habits.  You think you are pregnant. SEEK IMMEDIATE MEDICAL CARE IF:   You have increasing abdominal pain.  You feel sick to your stomach (nauseous), and you throw up (vomit).  You develop a fever that comes on suddenly.  You have abdominal pain during a bowel movement.  Your menstrual periods become heavier than usual. MAKE SURE YOU:  Understand these instructions.  Will watch your condition.  Will get help right away if you are not doing well or get worse. Document Released: 12/01/2005 Document Revised: 12/06/2013 Document Reviewed: 08/08/2013 [facility] Patient Information 2015 [place], [place]. This information is not intended to replace advice given to you by your health care provider. Make sure you discuss any questions you have with your health care provider. Trichomoniasis Trichomoniasis is an infection caused by an organism called Trichomonas. The infection can affect both women and men. In women, the outer female genitalia and the vagina are affected. In men, the penis is mainly affected, but the prostate and other reproductive organs can also be involved. Trichomoniasis is a sexually transmitted infection (STI) and is most often passed to another person through sexual contact.  RISK FACTORS  Having unprotected sexual intercourse.  Having sexual intercourse with an infected partner. SIGNS AND SYMPTOMS  Symptoms of trichomoniasis in women include:  Abnormal gray-green frothy vaginal discharge.  Itching  and irritation of the vagina.  Itching and irritation of the area outside the vagina. Symptoms of trichomoniasis in men include:   Penile discharge with or without pain.  Pain during urination. This results from inflammation of the urethra. DIAGNOSIS  Trichomoniasis may be found during a Pap test or physical exam. Your health care provider may use one of the following methods to help diagnose this infection:  Examining vaginal discharge under a microscope. For men, urethral discharge would be examined.  Testing the pH of the vagina with a test tape.  Using a vaginal swab test that checks for the Trichomonas organism. A test is available that provides results within a few minutes.  Doing a culture test for the organism. This is not usually needed. TREATMENT   You may be given medicine to fight the infection. Women should inform their health care provider if they could be or are pregnant. Some medicines used to treat the infection should not be taken during pregnancy.  Your health care provider may recommend over-the-counter medicines or creams to decrease itching or irritation.  Your sexual partner will need to be treated if infected. HOME CARE INSTRUCTIONS   Take medicines only as directed by your health care provider.  Take over-the-counter medicine for itching or irritation as directed by your health care provider.  Do  not have sexual intercourse while you have the infection.  Women should not douche or wear tampons while they have the infection.  Discuss your infection with your partner. Your partner may have gotten the infection from you, or you may have gotten it from your partner.  Have your sex partner get examined and treated if necessary.  Practice safe, informed, and protected sex.  See your health care provider for other STI testing. SEEK MEDICAL CARE IF:   You still have symptoms after you finish your medicine.  You develop abdominal pain.  You have pain  when you urinate.  You have bleeding after sexual intercourse.  You develop a rash.  Your medicine makes you sick or makes you throw up (vomit). MAKE SURE YOU:  Understand these instructions.  Will watch your condition.  Will get help right away if you are not doing well or get worse. Document Released: 05/27/2001 Document Revised: 04/17/2014 Document Reviewed: 09/12/2013 [facility] CorporationExitCare Patient Information 2015 [place]ExitCare, [place]LLC. This information is not intended to replace advice given to you by your health care provider. Make sure you discuss any questions you have with your health care provider.

## 2015-03-16 LAB — GC/CHLAMYDIA PROBE AMP (~~LOC~~) NOT AT ARMC
Chlamydia: POSITIVE — AB
NEISSERIA GONORRHEA: NEGATIVE

## 2015-03-17 LAB — BETA HCG QUANT (REF LAB): Beta hCG, Tumor Marker: <2 m[IU]/mL (ref <5.0)

## 2015-03-19 ENCOUNTER — Telehealth: Payer: Self-pay | Admitting: General Practice

## 2015-03-19 ENCOUNTER — Telehealth (HOSPITAL_COMMUNITY): Payer: Self-pay

## 2015-03-19 ENCOUNTER — Ambulatory Visit: Payer: Medicaid Other | Admitting: Obstetrics & Gynecology

## 2015-03-19 DIAGNOSIS — A749 Chlamydial infection, unspecified: Secondary | ICD-10-CM

## 2015-03-19 MED ORDER — AZITHROMYCIN 250 MG PO TABS: 1000.0000 mg | ORAL_TABLET | Freq: Once | ORAL | Status: DC

## 2015-03-19 NOTE — Telephone Encounter (Signed)
Patient no showed for appt today. Per chart review patient's test also came back positive for chlamydia and patient had not been notified. Called patient and she states she didn't have transportation this afternoon but would like appt rescheduled. Informed patient of + chlamydia, importance of treatment and to abstain from intercourse for next 2 weeks. Medication sent to pharmacy. Patient verbalized understanding to all and knows to notify partner. Patient had no questions

## 2015-03-19 NOTE — ED Notes (Signed)
Positive for Chllaymdia- chart sent to EDP office for review.

## 2015-03-19 NOTE — ED Notes (Signed)
Chart returned from EDP office. Per Santiago GladHeather Laisure pt needs azithromycin 1 gram po x 1. No refills. Attempted call x 1

## 2015-03-20 ENCOUNTER — Telehealth (HOSPITAL_BASED_OUTPATIENT_CLINIC_OR_DEPARTMENT_OTHER): Payer: Self-pay | Admitting: Emergency Medicine

## 2015-03-26 ENCOUNTER — Ambulatory Visit: Payer: Medicaid Other | Admitting: Obstetrics & Gynecology

## 2015-03-26 ENCOUNTER — Telehealth: Payer: Self-pay | Admitting: *Deleted

## 2015-03-26 NOTE — Telephone Encounter (Signed)
Kelsey Fitzgerald missed an appointment for mau fu complex cyst.  [facility]. Informed her registar would call her and reschedule appt. She states she is doing ok

## 2015-04-19 ENCOUNTER — Encounter: Payer: Medicaid Other | Admitting: Obstetrics & Gynecology

## 2015-10-22 ENCOUNTER — Encounter: Payer: Self-pay | Admitting: *Deleted

## 2017-01-23 IMAGING — US US TRANSVAGINAL NON-OB
1 series · 13 of 25 positions shown · non-contrast
Comparison: None

CLINICAL DATA: Abnormal uterine bruit bleeding, pelvic pressure
sensation normal. The patient's normal menstrual period ended 2
weeks ago; now with new onset of bleeding

EXAM:
TRANSABDOMINAL AND TRANSVAGINAL ULTRASOUND OF PELVIS
TECHNIQUE: Both transabdominal and transvaginal ultrasound examinations of the
pelvis were performed. Transabdominal technique was performed for
global imaging of the pelvis including uterus, ovaries, adnexal
regions, and pelvic cul-de-sac. It was necessary to proceed with
endovaginal exam following the transabdominal exam to visualize the
endometrium and ovaries.

[Series 1: us transvaginal non-ob · 0.17mm/px · 13 of 88 slices shown]
[im 1/88]
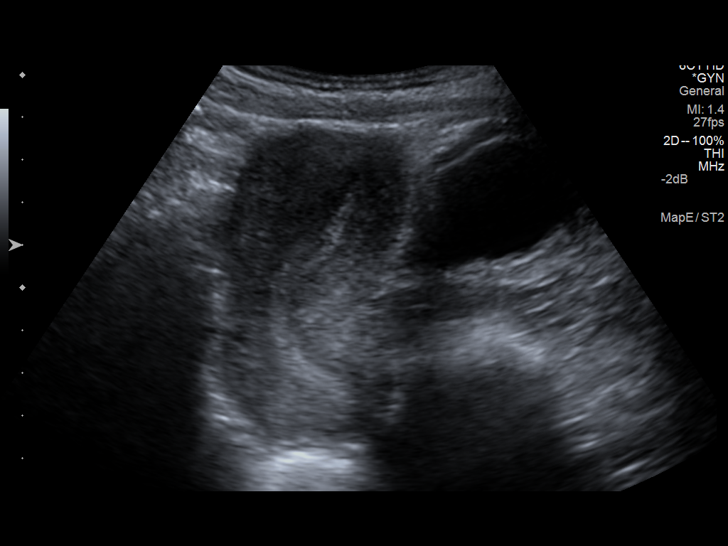
[im 8/88]
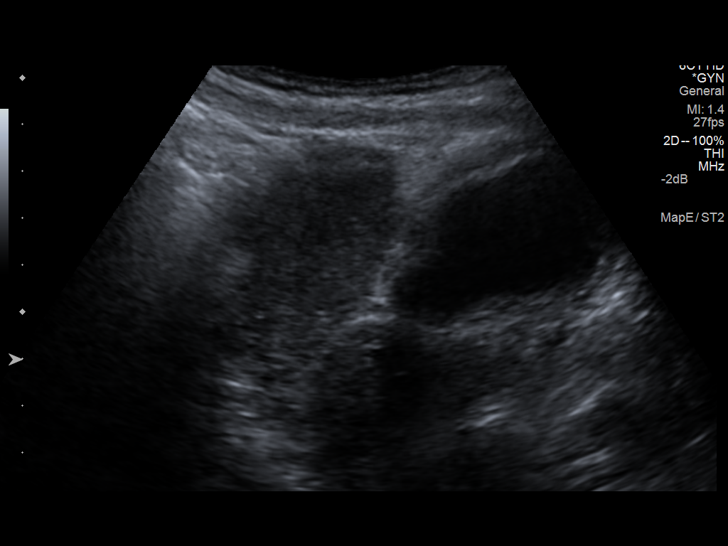
[im 15/88]
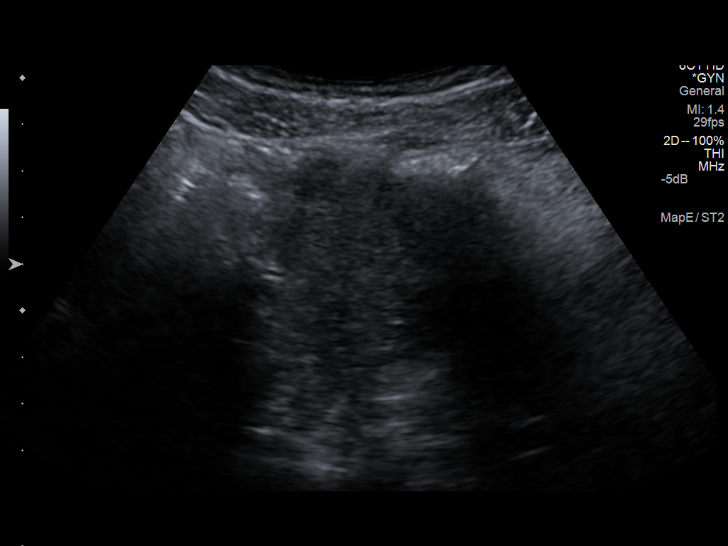
[im 22/88]
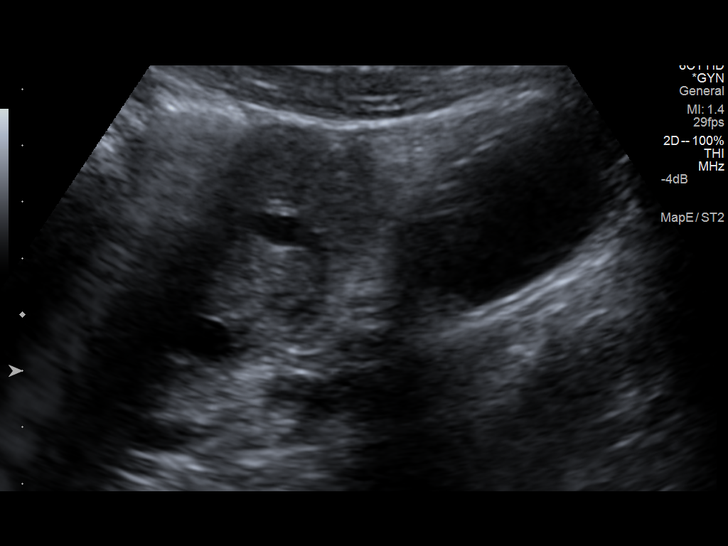
[im 30/88]
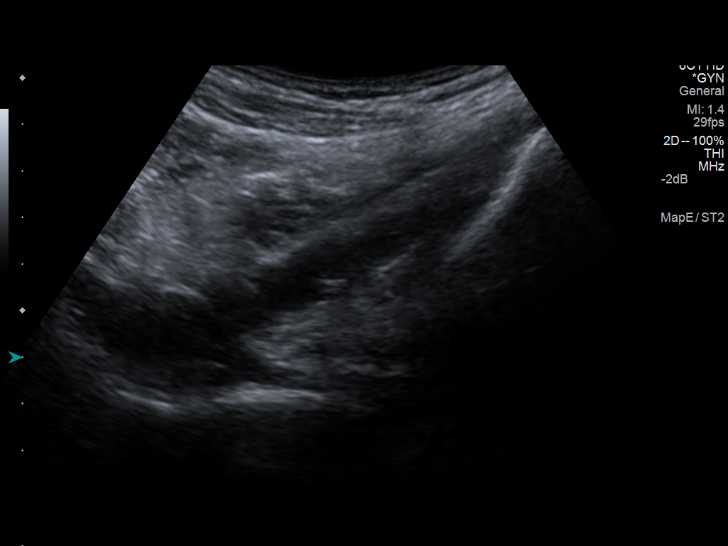
[im 37/88]
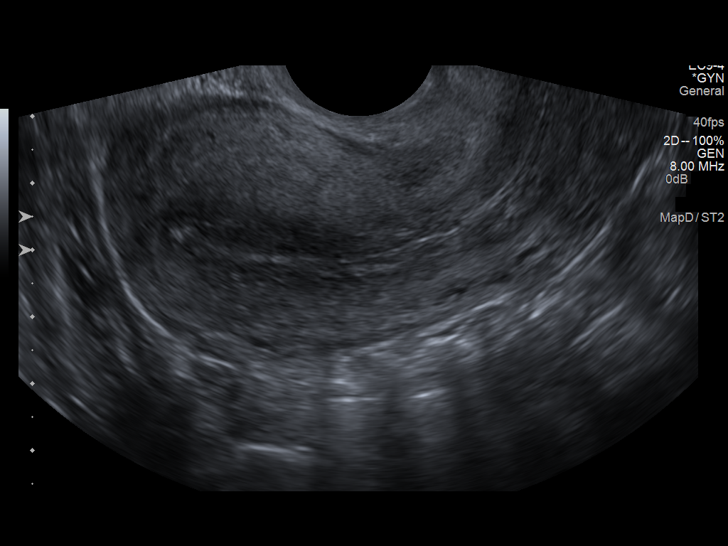
[im 44/88]
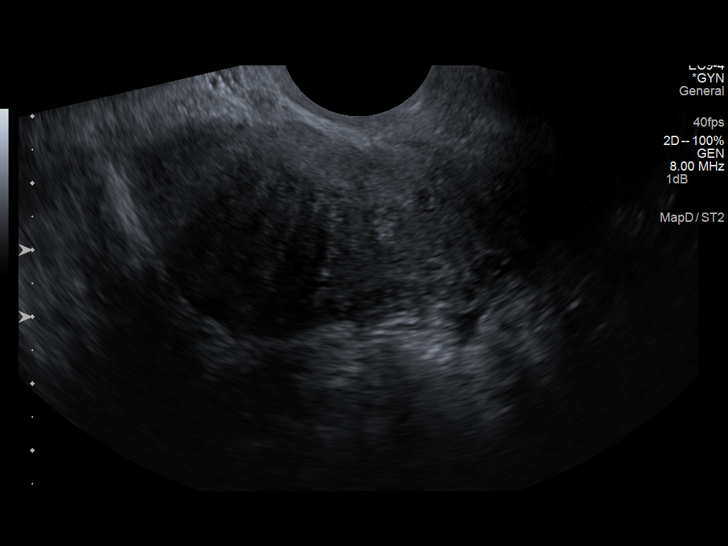
[im 51/88]
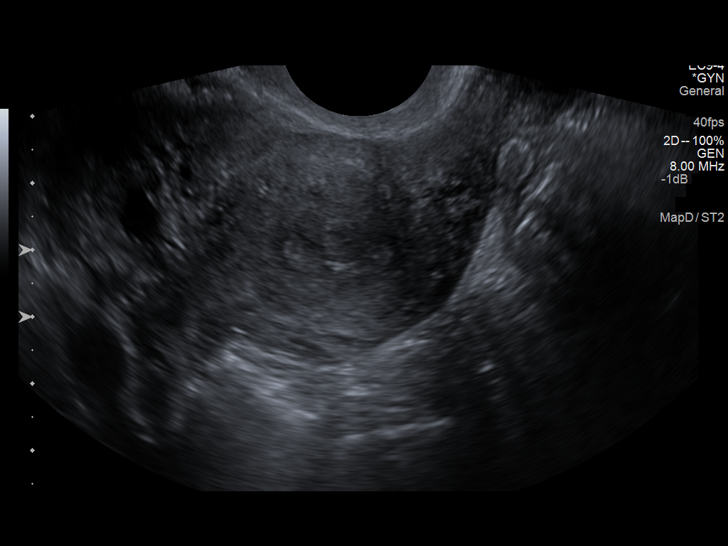
[im 59/88]
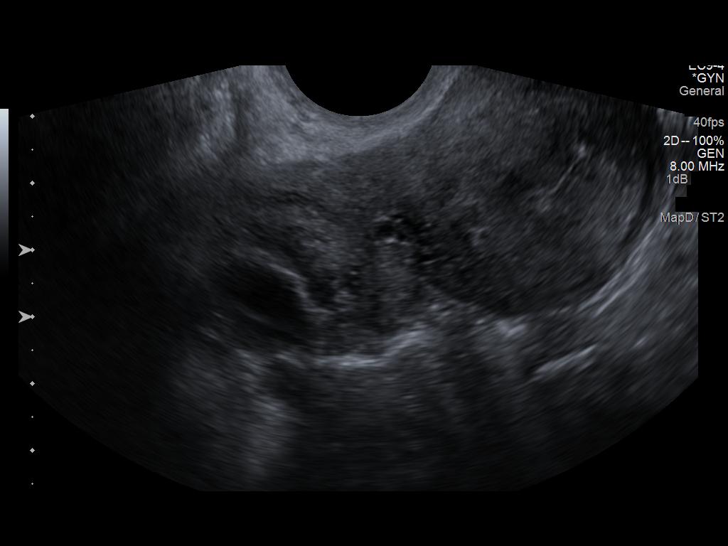
[im 66/88]
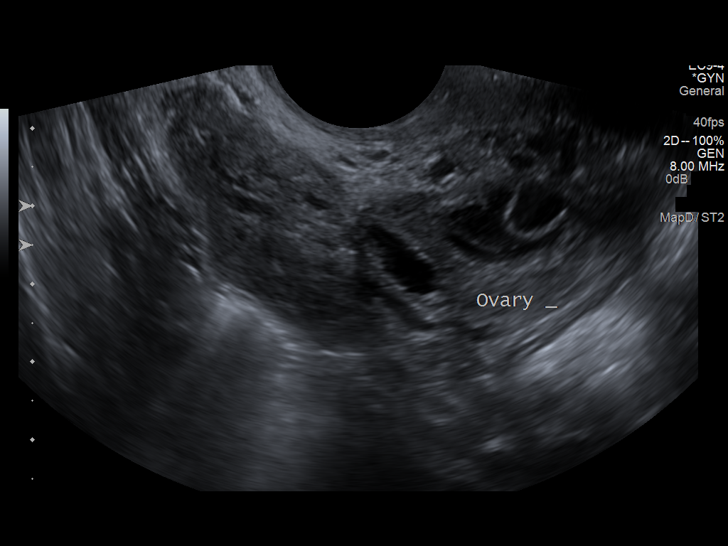
[im 73/88]
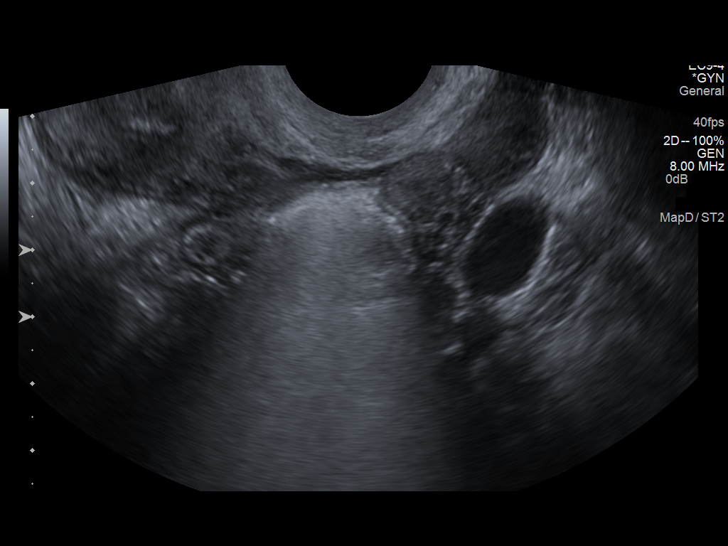
[im 80/88]
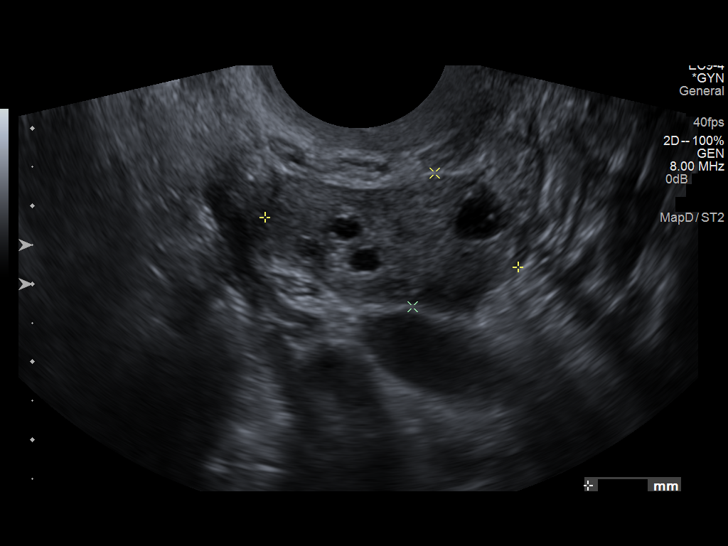
[im 88/88]
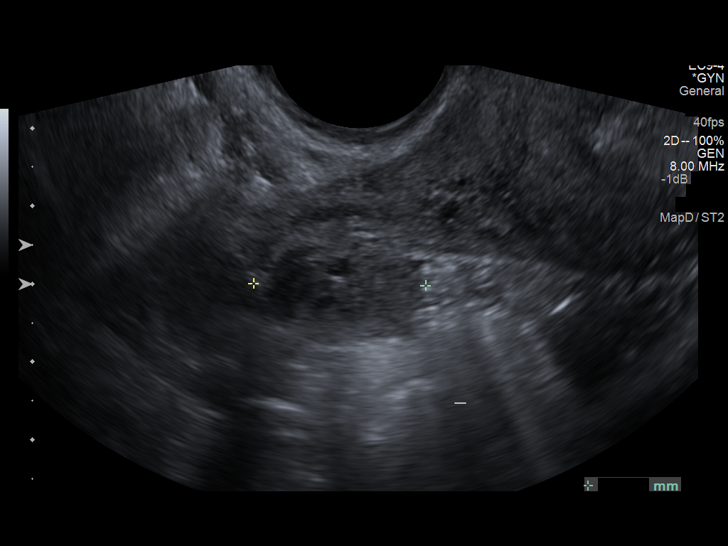

[13 of 25 positions shown; findings below may reference images not displayed]

FINDINGS: Uterus

Measurements: 8.9 x 4.2 x 4.4 cm. No fibroids or other mass
visualized.

Endometrium

Thickness: 7 mm.  The endometrial echotexture is heterogeneous.

Right ovary

Measurements: 3.4 x 1.9 x 3.1 cm. There is a complex cystic
structure in the right ovary measuring 3.5 x 2.2 x 2.2 cm. It is
difficult separate this from adjacent prominent adnexal vessels. The
patient was quite tender on examination here.

Left ovary

Measurements: 3.3 x 1.7 x 1.9 cm. Normal appearance/no adnexal mass.

Other findings

No free fluid.
IMPRESSION: 1. The echotexture of the uterus is normal. The endometrium exhibits
a heterogeneous echotexture but is not abnormally thickened. If
bleeding remains unresponsive to hormonal or medical therapy,
sonohysterogram should be considered for focal lesion work-up. (Ref:
Radiological Reasoning: Algorithmic Workup of Abnormal Vaginal
Bleeding with Endovaginal Sonography and Sonohysterography. AJR
[id number]; 191:S68-73).
2. Complex appearing cystic structure in the right ovary measuring
3.5 cm in greatest dimension. Correlation with the patient's beta
HCG is needed. If the beta HCG is negative, a follow-up ultrasound
in 6-12 weeks is recommended to reassess this structure. Follow-up
sooner is available if the patient's symptoms worsen. Pelvic MRI may
be useful if the patient's symptoms persist.
3. The left ovary is normal.  There is no free pelvic fluid.

## 2017-12-19 ENCOUNTER — Encounter (HOSPITAL_COMMUNITY): Payer: Self-pay | Admitting: *Deleted

## 2017-12-19 ENCOUNTER — Emergency Department (HOSPITAL_COMMUNITY)
Admission: EM | Admit: 2017-12-19 | Discharge: 2017-12-19 | Disposition: A | Discharge status: Home / Self Care | Payer: Medicaid Other | Attending: Emergency Medicine | Admitting: Emergency Medicine

## 2017-12-19 ENCOUNTER — Other Ambulatory Visit: Payer: Self-pay

## 2017-12-19 DIAGNOSIS — Z3A00 Weeks of gestation of pregnancy not specified: Secondary | ICD-10-CM | POA: Diagnosis not present

## 2017-12-19 DIAGNOSIS — O219 Vomiting of pregnancy, unspecified: Secondary | ICD-10-CM | POA: Diagnosis not present

## 2017-12-19 LAB — I-STAT BETA HCG BLOOD, ED (MC, WL, AP ONLY): I-stat hCG, quantitative: >2000 m[IU]/mL — ABNORMAL HIGH (ref <5)

## 2017-12-19 MED ORDER — METOCLOPRAMIDE HCL 10 MG PO TABS
10.0000 mg | ORAL_TABLET | Freq: Once | ORAL | Status: AC
  Administered 2017-12-19: 10 mg via ORAL
  Filled 2017-12-19: qty 1

## 2017-12-19 MED ORDER — METOCLOPRAMIDE HCL 10 MG PO TABS: 10.0000 mg | ORAL_TABLET | Freq: Three times a day (TID) | ORAL | 0 refills | Status: DC | PRN

## 2017-12-19 NOTE — ED Provider Notes (Signed)
[facility] EMERGENCY DEPARTMENT Provider Note   CSN: [id number] Arrival date & time: 12/19/17  1205     History   Chief Complaint Chief Complaint  Patient presents with  . Emesis During Pregnancy    HPI Kelsey Fitzgerald is a 27 y.o. female.  The history is provided by the patient and medical records.  Emesis   This is a new problem. Episode onset: several weeks ago. The problem occurs 2 to 4 times per day. The problem has not changed since onset.The emesis has an appearance of stomach contents. There has been no fever. Pertinent negatives include no abdominal pain, no chills, no cough, no diarrhea, no fever, no headaches, no sweats and no URI. Risk factors: pregnant.    Past Medical History:  Diagnosis Date  . Medical history non-contributory     There are no active problems to display for this patient.   Past Surgical History:  Procedure Laterality Date  . NO PAST SURGERIES      OB History    Gravida Para Term Preterm AB Living   2       1     SAB TAB Ectopic Multiple Live Births   1               Home Medications    Prior to Admission medications   Medication Sig Start Date End Date Taking? Authorizing Provider  azithromycin (ZITHROMAX) 250 MG tablet Take 4 tablets (1,000 mg total) by mouth once. 03/19/15   Adam Phenix, MD  cyclobenzaprine (FLEXERIL) 10 MG tablet Take 1 tablet (10 mg total) by mouth 3 (three) times daily as needed. Patient not taking: Reported on 03/15/2015 05/12/14   Pauline Aus, PA-C  HYDROcodone-acetaminophen (NORCO/VICODIN) 5-325 MG per tablet Take 2 tablets by mouth every 4 (four) hours as needed. 03/15/15   Elson Areas, PA-C  medroxyPROGESTERone (DEPO-PROVERA) 150 MG/ML injection Inject 150 mg into the muscle every 3 (three) months. Last injection December 2014    [provider]  metoCLOPramide (REGLAN) 10 MG tablet Take 1 tablet (10 mg total) by mouth every 8 (eight) hours as needed for up to 7 days for  nausea. 12/19/17 12/26/17  Forest Becker, MD  metroNIDAZOLE (FLAGYL) 500 MG tablet Take 1 tablet (500 mg total) by mouth 2 (two) times daily. 03/15/15   Elson Areas, PA-C  OVER THE COUNTER MEDICATION Take 2 tablets by mouth daily. Hair growth supplement    [provider]  traMADol (ULTRAM) 50 MG tablet Take 1 tablet (50 mg total) by mouth every 6 (six) hours as needed. Patient not taking: Reported on 03/15/2015 05/12/14   Pauline Aus, PA-C    Family History Family History  Problem Relation Age of Onset  . Alcohol abuse Neg Hx   . Arthritis Neg Hx   . Asthma Neg Hx   . Birth defects Neg Hx   . Cancer Neg Hx   . COPD Neg Hx   . Depression Neg Hx   . Diabetes Neg Hx   . Drug abuse Neg Hx   . Early death Neg Hx   . Hearing loss Neg Hx   . Heart disease Neg Hx   . Hyperlipidemia Neg Hx   . Hypertension Neg Hx   . Kidney disease Neg Hx   . Learning disabilities Neg Hx   . Mental illness Neg Hx   . Mental retardation Neg Hx   . Miscarriages / Stillbirths Neg Hx   . Stroke  Neg Hx   . Vision loss Neg Hx     Social History Social History   Tobacco Use  . Smoking status: Former Games developermoker  . Smokeless tobacco: Never Used  Substance Use Topics  . Alcohol use: Yes    Comment: occ.  . Drug use: No     Allergies   Ibuprofen and Lactose intolerance (gi)   Review of Systems Review of Systems  Constitutional: Negative for chills and fever.  HENT: Negative for rhinorrhea and sore throat.   Respiratory: Negative for cough.   Cardiovascular: Negative for chest pain.  Gastrointestinal: Positive for vomiting. Negative for abdominal pain and diarrhea.  Genitourinary: Negative for dysuria and vaginal bleeding.  Musculoskeletal: Negative for back pain.  Skin: Negative for rash.  Allergic/Immunologic: Negative for immunocompromised state.  Neurological: Negative for syncope and headaches.  Psychiatric/Behavioral: Negative for confusion.  All other systems reviewed and  are negative.    Physical Exam Updated Vital Signs BP 138/72 (BP Location: Right Arm)   Pulse 77   Temp 98.4 F (36.9 C) (Oral)   Resp 20   Ht 5\' 8"  (1.727 m)   Wt 57.6 kg (127 lb)   LMP 10/22/2017   SpO2 98%   BMI 19.31 kg/m   Physical Exam  Constitutional: She appears well-developed and well-nourished. No distress.  HENT:  Head: Normocephalic and atraumatic.  Eyes: Conjunctivae are normal.  Neck: Neck supple.  Cardiovascular: Normal rate and regular rhythm.  No murmur heard. Pulmonary/Chest: Effort normal and breath sounds normal. No respiratory distress.  Abdominal: Soft. There is no tenderness.  Musculoskeletal: She exhibits no edema.  Neurological: She is alert.  Skin: Skin is warm and dry.  Psychiatric: She has a normal mood and affect.  Nursing note and vitals reviewed.    ED Treatments / Results  Labs (all labs ordered are listed, but only abnormal results are displayed) Labs Reviewed  I-STAT BETA HCG BLOOD, ED ([facility], [facility], [facility] ONLY) - Abnormal; Notable for the following components:      Result Value   I-stat hCG, quantitative >2,000.0 (*)    All other components within normal limits    EKG  EKG Interpretation None       Radiology No results found.  Procedures Procedures (including critical care time)  Medications Ordered in ED Medications  metoCLOPramide (REGLAN) tablet 10 mg (not administered)     Initial Impression / Assessment and Plan / ED Course  I have reviewed the triage vital signs and the nursing notes.  Pertinent labs & imaging results that were available during my care of the patient were reviewed by me and considered in my medical decision making (see chart for details).     Pt presents w/emesis in pregnancy. LMP [id number]. Says she took a pregnancy test at home that was positive and she is here today to determine the gestational age of her fetus. Her only complaint is nausea, and would also like something for that.  VS & exam as  above. Labs remarkable for hCG >2000. BSUS showed single IUP with observable FHR, but explained to the Pt that EGA was not going to be reliable. Recommended f/u with OB for additional testing & prenatal care.  Explained all results to the Pt. Will discharge the Pt home with rx for Reglan. Recommending follow-up with OB. ED return precautions provided. Pt acknowledged understanding of, and concurrence with the plan. All questions answered to her satisfaction. In stable condition at the time of discharge.  Final Clinical Impressions(s) /  ED Diagnoses   Final diagnoses:  Nausea and vomiting during pregnancy    ED Discharge Orders        Ordered    metoCLOPramide (REGLAN) 10 MG tablet  Every 8 hours PRN     12/19/17 1800       Forest Becker, MD 12/19/17 1801    Nira Conn, MD 12/19/17 [phone]

## 2017-12-19 NOTE — ED Triage Notes (Addendum)
Pt states she would like to know how far along she is in her pregnancy so that she can apply for medicaid.  She took a home pregnancy test at home that was positive.  She has been vomiting but denies abdominal pain and bleeding.  First day of last menstrual cycle was Nov 8th.

## 2017-12-19 NOTE — ED Notes (Signed)
Patient given fluids to assess tolerance per MD request-will continue to monitor

## 2017-12-19 NOTE — ED Notes (Signed)
Pt tolerating fluids well and to be d/c.

## 2017-12-19 NOTE — ED Notes (Signed)
ED Provider at bedside. 

## 2018-06-09 ENCOUNTER — Inpatient Hospital Stay (HOSPITAL_COMMUNITY)
Admission: AD | Admit: 2018-06-09 | Discharge: 2018-06-09 | Disposition: A | Discharge status: Home / Self Care | Payer: Medicaid Other | Source: Ambulatory Visit | Attending: Obstetrics & Gynecology | Admitting: Obstetrics & Gynecology

## 2018-06-09 ENCOUNTER — Encounter (HOSPITAL_COMMUNITY): Payer: Self-pay | Admitting: *Deleted

## 2018-06-09 DIAGNOSIS — Z3A32 32 weeks gestation of pregnancy: Secondary | ICD-10-CM | POA: Insufficient documentation

## 2018-06-09 DIAGNOSIS — O163 Unspecified maternal hypertension, third trimester: Secondary | ICD-10-CM | POA: Insufficient documentation

## 2018-06-09 DIAGNOSIS — Z87891 Personal history of nicotine dependence: Secondary | ICD-10-CM | POA: Diagnosis not present

## 2018-06-09 DIAGNOSIS — O1400 Mild to moderate pre-eclampsia, unspecified trimester: Secondary | ICD-10-CM

## 2018-06-09 DIAGNOSIS — O1493 Unspecified pre-eclampsia, third trimester: Secondary | ICD-10-CM | POA: Diagnosis not present

## 2018-06-09 HISTORY — DX: Gestational (pregnancy-induced) hypertension without significant proteinuria, unspecified trimester: O13.9

## 2018-06-09 LAB — COMPREHENSIVE METABOLIC PANEL
ALBUMIN: 2.7 g/dL — AB (ref 3.5–5.0)
ALK PHOS: 83 U/L (ref 38–126)
ALT: 15 U/L (ref 0–44)
AST: 22 U/L (ref 15–41)
Anion gap: 7 (ref 5–15)
BILIRUBIN TOTAL: 0.4 mg/dL (ref 0.3–1.2)
BUN: 10 mg/dL (ref 6–20)
CALCIUM: 8.7 mg/dL — AB (ref 8.9–10.3)
CO2: 21 mmol/L — ABNORMAL LOW (ref 22–32)
CREATININE: 0.61 mg/dL (ref 0.44–1.00)
Chloride: 104 mmol/L (ref 98–111)
GFR calc Af Amer: >60 mL/min (ref >60)
GFR calc non Af Amer: >60 mL/min (ref >60)
Glucose, Bld: 72 mg/dL (ref 70–99)
Potassium: 4 mmol/L (ref 3.5–5.1)
Sodium: 132 mmol/L — ABNORMAL LOW (ref 135–145)
Total Protein: 5.8 g/dL — ABNORMAL LOW (ref 6.5–8.1)

## 2018-06-09 LAB — CBC
HEMATOCRIT: 34.1 % — AB (ref 36.0–46.0)
Hemoglobin: 11.6 g/dL — ABNORMAL LOW (ref 12.0–15.0)
MCH: 29.8 pg (ref 26.0–34.0)
MCHC: 34 g/dL (ref 30.0–36.0)
MCV: 87.7 fL (ref 78.0–100.0)
Platelets: 274 10*3/uL (ref 150–400)
RBC: 3.89 MIL/uL (ref 3.87–5.11)
RDW: 12.8 % (ref 11.5–15.5)
WBC: 8.8 10*3/uL (ref 4.0–10.5)

## 2018-06-09 LAB — URIC ACID: Uric Acid, Serum: 3.7 mg/dL (ref 2.5–7.1)

## 2018-06-09 LAB — PROTEIN / CREATININE RATIO, URINE
Creatinine, Urine: 179 mg/dL
Protein Creatinine Ratio: 0.35 mg/mg{Cre} — ABNORMAL HIGH (ref 0.00–0.15)
Total Protein, Urine: 63 mg/dL

## 2018-06-09 MED ORDER — BETAMETHASONE SOD PHOS & ACET 6 (3-3) MG/ML IJ SUSP
12.0000 mg | Freq: Once | INTRAMUSCULAR | Status: AC
  Administered 2018-06-09: 12 mg via INTRAMUSCULAR
  Filled 2018-06-09: qty 2

## 2018-06-09 NOTE — MAU Provider Note (Signed)
History     CSN: [id number]  Arrival date and time: 06/09/18 1448   None     Chief Complaint  Patient presents with  . Hypertension   HPI 27 yo G3P1 at 32 weeks 6 days sent from office for elevated BPs and proteinuria.  Patient denies HA, no scotomata or visual changes, she denies RUQ pain.  She denies ctx, no LOF, no vaginal bleeding, reports good fetal movement.   OB History    Gravida  3   Para      Term      Preterm      AB  1   Living  1     SAB  1   TAB      Ectopic      Multiple      Live Births              Past Medical History:  Diagnosis Date  . Pregnancy induced hypertension     Past Surgical History:  Procedure Laterality Date  . NO PAST SURGERIES      Family History  Problem Relation Age of Onset  . Alcohol abuse Neg Hx   . Arthritis Neg Hx   . Asthma Neg Hx   . Birth defects Neg Hx   . Cancer Neg Hx   . COPD Neg Hx   . Depression Neg Hx   . Diabetes Neg Hx   . Drug abuse Neg Hx   . Early death Neg Hx   . Hearing loss Neg Hx   . Heart disease Neg Hx   . Hyperlipidemia Neg Hx   . Hypertension Neg Hx   . Kidney disease Neg Hx   . Learning disabilities Neg Hx   . Mental illness Neg Hx   . Mental retardation Neg Hx   . Miscarriages / Stillbirths Neg Hx   . Stroke Neg Hx   . Vision loss Neg Hx     Social History   Tobacco Use  . Smoking status: Former Games developermoker  . Smokeless tobacco: Never Used  Substance Use Topics  . Alcohol use: Not Currently    Comment: occ.  . Drug use: No    Allergies:  Allergies  Allergen Reactions  . Ibuprofen Hives  . Lactose Intolerance (Gi) Nausea And Vomiting    Medications Prior to Admission  Medication Sig Dispense Refill Last Dose  . azithromycin (ZITHROMAX) 250 MG tablet Take 4 tablets (1,000 mg total) by mouth once. 4 tablet 0   . cyclobenzaprine (FLEXERIL) 10 MG tablet Take 1 tablet (10 mg total) by mouth 3 (three) times daily as needed. (Patient not taking: Reported on  03/15/2015) 21 tablet 0 Completed Course at Unknown time  . HYDROcodone-acetaminophen (NORCO/VICODIN) 5-325 MG per tablet Take 2 tablets by mouth every 4 (four) hours as needed. 16 tablet 0   . medroxyPROGESTERone (DEPO-PROVERA) 150 MG/ML injection Inject 150 mg into the muscle every 3 (three) months. Last injection December 2014   Not Taking at Unknown time  . metoCLOPramide (REGLAN) 10 MG tablet Take 1 tablet (10 mg total) by mouth every 8 (eight) hours as needed for up to 7 days for nausea. 21 tablet 0   . metroNIDAZOLE (FLAGYL) 500 MG tablet Take 1 tablet (500 mg total) by mouth 2 (two) times daily. 14 tablet 0   . OVER THE COUNTER MEDICATION Take 2 tablets by mouth daily. Hair growth supplement   03/14/2015 at Unknown time  . traMADol (ULTRAM) 50 MG tablet  Take 1 tablet (50 mg total) by mouth every 6 (six) hours as needed. (Patient not taking: Reported on 03/15/2015) 15 tablet 0 Completed Course at Unknown time    Review of Systems Physical Exam   Blood pressure (!) 148/82, pulse 65, temperature 98.2 F (36.8 C), temperature source Oral, resp. rate 18, height 5' 7.5" (1.715 m), weight 180 lb (81.6 kg), last menstrual period 10/22/2017.  Physical Exam  GEN: AAOx3 NAD ABD: Gravid, soft, NT EXT: 1+ edema, non pitting NEURO: 2+ DTRs, no clonus GU:  Cervical exam deferred  MAU Course  Procedures * [facility] labs ordered Labs: WBC/Hgb/Hct/Plts:  8.8/11.6/34.1/274 (06/26 1545) BUN/Cr/glu/ALT/AST/amyl/lip:  10/0.61/--/15/22/--/-- (06/26 1545)   Na/K/Cl/CO2:  132/4.0/104/21 (06/26 1545) No results found for this or any previous visit (from the past 720 hour(s)).    Assessment and Plan  27 year old at 32 weeks 6 days with preeclampsia w/o severe features Betamethasone 12 mg today and another dose tomorrow NST Friday BPP next week IOL at term.  Discussed PRE E precautions with patient  Wynonia Hazard 06/09/2018, 3:38 PM

## 2018-06-09 NOTE — MAU Note (Signed)
Pt states her hands, ankles & feet have been swollen for the last 3-4 days, went to OB office, BP elevated, sent to MAU.  Denies HA, visual changes or epigastric pain.  No uc's, bleeding or LOF.

## 2018-06-09 NOTE — Discharge Instructions (Signed)
Preeclampsia and Eclampsia °Preeclampsia is a serious condition that develops only during pregnancy. It is also called toxemia of pregnancy. This condition causes high blood pressure along with other symptoms, such as swelling and headaches. These symptoms may develop as the condition gets worse. Preeclampsia may occur at 20 weeks of pregnancy or later. °Diagnosing and treating preeclampsia early is very important. If not treated early, it can cause serious problems for you and your baby. One problem it can lead to is eclampsia, which is a condition that causes muscle jerking or shaking (convulsions or seizures) in the mother. Delivering your baby is the best treatment for preeclampsia or eclampsia. Preeclampsia and eclampsia symptoms usually go away after your baby is born. °What are the causes? °The cause of preeclampsia is not known. °What increases the risk? °The following risk factors make you more likely to develop preeclampsia: °· Being pregnant for the first time. °· Having had preeclampsia during a past pregnancy. °· Having a family history of preeclampsia. °· Having high blood pressure. °· Being pregnant with twins or triplets. °· Being 35 or older. °· Being African-American. °· Having kidney disease or diabetes. °· Having medical conditions such as lupus or blood diseases. °· Being very overweight (obese). ° °What are the signs or symptoms? °The earliest signs of preeclampsia are: °· High blood pressure. °· Increased protein in your urine. Your health care provider will check for this at every visit before you give birth (prenatal visit). ° °Other symptoms that may develop as the condition gets worse include: °· Severe headaches. °· Sudden weight gain. °· Swelling of the hands, face, legs, and feet. °· Nausea and vomiting. °· Vision problems, such as blurred or double vision. °· Numbness in the face, arms, legs, and feet. °· Urinating less than usual. °· Dizziness. °· Slurred speech. °· Abdominal pain,  especially upper abdominal pain. °· Convulsions or seizures. ° °Symptoms generally go away after giving birth. °How is this diagnosed? °There are no screening tests for preeclampsia. Your health care provider will ask you about symptoms and check for signs of preeclampsia during your prenatal visits. You may also have tests that include: °· Urine tests. °· Blood tests. °· Checking your blood pressure. °· Monitoring your baby’s heart rate. °· Ultrasound. ° °How is this treated? °You and your health care provider will determine the treatment approach that is best for you. Treatment may include: °· Having more frequent prenatal exams to check for signs of preeclampsia, if you have an increased risk for preeclampsia. °· Bed rest. °· Reducing how much salt (sodium) you eat. °· Medicine to lower your blood pressure. °· Staying in the hospital, if your condition is severe. There, treatment will focus on controlling your blood pressure and the amount of fluids in your body (fluid retention). °· You may need to take medicine (magnesium sulfate) to prevent seizures. This medicine may be given as an injection or through an IV tube. °· Delivering your baby early, if your condition gets worse. You may have your labor started with medicine (induced), or you may have a cesarean delivery. ° °Follow these instructions at home: °Eating and drinking ° °· Drink enough fluid to keep your urine clear or pale yellow. °· Eat a healthy diet that is low in sodium. Do not add salt to your food. Check nutrition labels to see how much sodium a food or beverage contains. °· Avoid caffeine. °Lifestyle °· Do not use any products that contain nicotine or tobacco, such as cigarettes   and e-cigarettes. If you need help quitting, ask your health care provider. °· Do not use alcohol or drugs. °· Avoid stress as much as possible. Rest and get plenty of sleep. °General instructions °· Take over-the-counter and prescription medicines only as told by your  health care provider. °· When lying down, lie on your side. This keeps pressure off of your baby. °· When sitting or lying down, raise (elevate) your feet. Try putting some pillows underneath your lower legs. °· Exercise regularly. Ask your health care provider what kinds of exercise are best for you. °· Keep all follow-up and prenatal visits as told by your health care provider. This is important. °How is this prevented? °To prevent preeclampsia or eclampsia from developing during another pregnancy: °· Get proper medical care during pregnancy. Your health care provider may be able to prevent preeclampsia or diagnose and treat it early. °· Your health care provider may have you take a low-dose aspirin or a calcium supplement during your next pregnancy. °· You may have tests of your blood pressure and kidney function after giving birth. °· Maintain a healthy weight. Ask your health care provider for help managing weight gain during pregnancy. °· Work with your health care provider to manage any long-term (chronic) health conditions you have, such as diabetes or kidney problems. ° °Contact a health care provider if: °· You gain more weight than expected. °· You have headaches. °· You have nausea or vomiting. °· You have abdominal pain. °· You feel dizzy or light-headed. °Get help right away if: °· You develop sudden or severe swelling anywhere in your body. This usually happens in the legs. °· You gain 5 lbs (2.3 kg) or more during one week. °· You have severe: °? Abdominal pain. °? Headaches. °? Dizziness. °? Vision problems. °? Confusion. °? Nausea or vomiting. °· You have a seizure. °· You have trouble moving any part of your body. °· You develop numbness in any part of your body. °· You have trouble speaking. °· You have any abnormal bleeding. °· You pass out. °This information is not intended to replace advice given to you by your health care provider. Make sure you discuss any questions you have with your health  care provider. °Document Released: 11/28/2000 Document Revised: 07/29/2016 Document Reviewed: 07/07/2016 °[employer]vier Interactive Patient Education © 2018 [employer] ° °

## 2018-06-10 ENCOUNTER — Inpatient Hospital Stay (HOSPITAL_COMMUNITY)
Admission: AD | Admit: 2018-06-10 | Discharge: 2018-06-10 | Disposition: A | Discharge status: Home / Self Care | Payer: Medicaid Other | Attending: Obstetrics and Gynecology | Admitting: Obstetrics and Gynecology

## 2018-06-10 DIAGNOSIS — Z3A00 Weeks of gestation of pregnancy not specified: Secondary | ICD-10-CM | POA: Diagnosis not present

## 2018-06-10 DIAGNOSIS — O1410 Severe pre-eclampsia, unspecified trimester: Secondary | ICD-10-CM | POA: Insufficient documentation

## 2018-06-10 MED ORDER — BETAMETHASONE SOD PHOS & ACET 6 (3-3) MG/ML IJ SUSP
12.0000 mg | Freq: Once | INTRAMUSCULAR | Status: AC
  Administered 2018-06-10: 12 mg via INTRAMUSCULAR
  Filled 2018-06-10: qty 2

## 2018-06-10 NOTE — MAU Note (Signed)
Pt was given Pre-E precautions and advised to go to the office for a BP check tomorrow.Pt verbalized understanding.

## 2018-06-10 NOTE — MAU Note (Signed)
Pt here for 2nd BMZ, no complaints

## 2018-06-11 ENCOUNTER — Encounter (HOSPITAL_COMMUNITY): Payer: Self-pay | Admitting: *Deleted

## 2018-06-11 ENCOUNTER — Inpatient Hospital Stay (HOSPITAL_COMMUNITY)
Admission: AD | Admit: 2018-06-11 | Discharge: 2018-06-11 | Disposition: A | Discharge status: Home / Self Care | Payer: Medicaid Other | Source: Ambulatory Visit | Attending: Obstetrics & Gynecology | Admitting: Obstetrics & Gynecology

## 2018-06-11 ENCOUNTER — Other Ambulatory Visit: Payer: Self-pay

## 2018-06-11 DIAGNOSIS — Z3A33 33 weeks gestation of pregnancy: Secondary | ICD-10-CM | POA: Diagnosis not present

## 2018-06-11 DIAGNOSIS — O1493 Unspecified pre-eclampsia, third trimester: Secondary | ICD-10-CM

## 2018-06-11 DIAGNOSIS — Z87891 Personal history of nicotine dependence: Secondary | ICD-10-CM | POA: Insufficient documentation

## 2018-06-11 DIAGNOSIS — O113 Pre-existing hypertension with pre-eclampsia, third trimester: Secondary | ICD-10-CM | POA: Diagnosis present

## 2018-06-11 DIAGNOSIS — Z886 Allergy status to analgesic agent status: Secondary | ICD-10-CM | POA: Insufficient documentation

## 2018-06-11 DIAGNOSIS — E739 Lactose intolerance, unspecified: Secondary | ICD-10-CM | POA: Insufficient documentation

## 2018-06-11 LAB — URINALYSIS, ROUTINE W REFLEX MICROSCOPIC
Bilirubin Urine: NEGATIVE
Glucose, UA: >=500 mg/dL — AB
Ketones, ur: NEGATIVE mg/dL
Leukocytes, UA: NEGATIVE
Nitrite: NEGATIVE
Protein, ur: 100 mg/dL — AB
Specific Gravity, Urine: 1.03 (ref 1.005–1.030)
pH: 6 (ref 5.0–8.0)

## 2018-06-11 NOTE — MAU Note (Signed)
Pt sent from office for BP eval & NST.  Denies H/A, visual disturbances, ^swellin and epigastric pain.  Reports seeing "black spots" earlier today.  Denies VB or LOF.  Reports +FM.

## 2018-06-11 NOTE — MAU Provider Note (Signed)
Chief Complaint:  BP eval and Non Stress Test   None    HPI: Kelsey Fitzgerald is a 27 y.o. G3P0011 at [redacted]w[redacted]d who presents to maternity admissions reporting was told by ccob office to come to MAU for BP check and NST. Pt was in the MAU on 06/26 for evaluation of high BP and dx with preeclampsia without severe features. Pt denies HA, vision changes, swelling, or RUQ pain, CP, SOB. . Denies contractions, leakage of fluid or vaginal bleeding. Good fetal movement.   Pregnancy Course:   Past Medical History:  Diagnosis Date  . Pregnancy induced hypertension    OB History  Gravida Para Term Preterm AB Living  3       1 1   SAB TAB Ectopic Multiple Live Births  1            # Outcome Date GA Lbr Len/2nd Weight Sex Delivery Anes PTL Lv  3 Current           2 Gravida              Birth Comments: System Generated. Please review and update pregnancy details.  1 SAB            Past Surgical History:  Procedure Laterality Date  . NO PAST SURGERIES     Family History  Problem Relation Age of Onset  . Alcohol abuse Neg Hx   . Arthritis Neg Hx   . Asthma Neg Hx   . Birth defects Neg Hx   . Cancer Neg Hx   . COPD Neg Hx   . Depression Neg Hx   . Diabetes Neg Hx   . Drug abuse Neg Hx   . Early death Neg Hx   . Hearing loss Neg Hx   . Heart disease Neg Hx   . Hyperlipidemia Neg Hx   . Hypertension Neg Hx   . Kidney disease Neg Hx   . Learning disabilities Neg Hx   . Mental illness Neg Hx   . Mental retardation Neg Hx   . Miscarriages / Stillbirths Neg Hx   . Stroke Neg Hx   . Vision loss Neg Hx    Social History   Tobacco Use  . Smoking status: Former Games developer  . Smokeless tobacco: Never Used  . Tobacco comment: Stopped 1 year ago  Substance Use Topics  . Alcohol use: Not Currently    Comment: occ.  . Drug use: No   Allergies  Allergen Reactions  . Ibuprofen Hives  . Lactose Intolerance (Gi) Nausea And Vomiting   Medications Prior to Admission  Medication Sig Dispense  Refill Last Dose  . calcium carbonate (TUMS - DOSED IN MG ELEMENTAL CALCIUM) 500 MG chewable tablet Chew 1 tablet by mouth 2 (two) times daily as needed for indigestion or heartburn.   Past Month at Unknown time    I have reviewed patient's Past Medical Hx, Surgical Hx, Family Hx, Social Hx, medications and allergies.   ROS:  Review of Systems  All other systems reviewed and are negative.   Physical Exam   Patient Vitals for the past 24 hrs:  BP Temp Temp src Pulse Resp SpO2 Height Weight  06/11/18 1517 (!) 147/80 - - 66 - - - -  06/11/18 1500 (!) 152/95 - - 67 - - - -  06/11/18 1445 (!) 141/93 - - 66 - - - -  06/11/18 1434 (!) 154/88 - - 68 - - - -  06/11/18 1429 - - - - -  100 % - -  06/11/18 1424 - - - - - 99 % - -  06/11/18 1415 - - - - - 99 % - -  06/11/18 1414 - - - - - 99 % - -  06/11/18 1413 (!) 145/95 98.2 F (36.8 C) Oral 68 18 - - -  06/11/18 1404 - - - - - - 5' 7.5" (1.715 m) 83.3 kg (183 lb 12 oz)   Constitutional: Well-developed, well-nourished female in no acute distress.  Cardiovascular: normal rate Respiratory: normal effort GI: Abd soft, non-tender, gravid appropriate for gestational age. Pos BS x 4 MS: Extremities nontender, no edema, normal ROM Neurologic: Alert and oriented x 4. +2 patellar reflex, no clonus.  GU: Neg CVAT. Pelvic: deferred     NST: FHR baseline 140 bpm, Variability: absent, Accelerations:present, Decelerations:  Absent= Cat 1/Reactive UC:   none SVE: Deferred   , vertex verified by fetal sutures.   Labs: No results found for this or any previous visit (from the past 24 hour(s)).  Imaging:  No results found.  MAU Course: Orders Placed This Encounter  Procedures  . Urinalysis, Routine w reflex microscopic  . Diet - low sodium heart healthy  . Fetal non-stress test  . Vital signs  . Increase activity slowly  . Call MD for:  . Call MD for:  temperature >100.4  . Call MD for:  persistant nausea and vomiting  . Call MD for:   severe uncontrolled pain  . Call MD for:  redness, tenderness, or signs of infection (pain, swelling, redness, odor or green/yellow discharge around incision site)  . Call MD for:  difficulty breathing, headache or visual disturbances  . Call MD for:  hives  . Call MD for:  persistant dizziness or light-headedness  . Call MD for:  extreme fatigue  . Discharge patient Discharge disposition: 01-Home or Self Care; Discharge patient date: 06/11/2018   No orders of the defined types were placed in this encounter.   MDM: BP, NST, discharge home.   Assessment: 1. Pre-eclampsia in third trimester   2. [redacted] weeks gestation of pregnancy      * PIH labs results from 06/09/2018 Labs: WBC/Hgb/Hct/Plts:  8.8/11.6/34.1/274 (06/26 1545) BUN/Cr/glu/ALT/AST/amyl/lip:  10/0.61/--/15/22/--/-- (06/26 1545) Na/K/Cl/CO2:  132/4.0/104/21 (06/26 1545) No results found for this or any previous visit (from the past 720 hour(s)).    Assessment and Plan  27 year old at 33 weeks 1 days with preeclampsia w/o severe features in stable Condition. Cat 1 fetal tracing.  Betamethasone complete on 06/27 NST Reactive BPP next week IOL at term.  Discussed PRE E precautions with patient  Discharge home in stable condition.   Check BP at home if greater than 160/110 go to MAU.   Labor/Preeclampsia precautions and fetal kick counts Follow-up Information    [facility] Follow up in 1 week(s).   Specialty:  Obstetrics and Gynecology Why:  For BBP Contact information: [address] [place] [phone]          Allergies as of 06/11/2018      Reactions   Ibuprofen Hives   Lactose Intolerance (gi) Nausea And Vomiting      Medication List    TAKE these medications   calcium carbonate 500 MG chewable tablet Commonly known as:  TUMS - dosed in mg elemental calcium Chew 1 tablet by mouth 2 (two) times daily as needed for indigestion  or heartburn.  [facility] NP-C, CNM [place], [place], [place] 06/11/2018 3:22 PM

## 2018-06-11 NOTE — MAU Note (Signed)
Urine in lab 

## 2018-06-16 ENCOUNTER — Encounter (HOSPITAL_COMMUNITY)
Admission: AD | Disposition: A | Discharge status: Home / Self Care | Payer: Self-pay | Source: Home / Self Care | Attending: Obstetrics & Gynecology

## 2018-06-16 ENCOUNTER — Inpatient Hospital Stay (HOSPITAL_COMMUNITY)
Admission: AD | Admit: 2018-06-16 | Discharge: 2018-06-21 | DRG: 788 | Disposition: A | Discharge status: Home / Self Care | Payer: Medicaid Other | Attending: Obstetrics & Gynecology | Admitting: Obstetrics & Gynecology

## 2018-06-16 ENCOUNTER — Inpatient Hospital Stay (HOSPITAL_COMMUNITY): Payer: Medicaid Other | Admitting: Certified Registered Nurse Anesthetist

## 2018-06-16 ENCOUNTER — Other Ambulatory Visit: Payer: Self-pay

## 2018-06-16 ENCOUNTER — Encounter (HOSPITAL_COMMUNITY): Payer: Self-pay | Admitting: *Deleted

## 2018-06-16 DIAGNOSIS — O358XX0 Maternal care for other (suspected) fetal abnormality and damage, not applicable or unspecified: Secondary | ICD-10-CM | POA: Diagnosis present

## 2018-06-16 DIAGNOSIS — O99824 Streptococcus B carrier state complicating childbirth: Secondary | ICD-10-CM | POA: Diagnosis present

## 2018-06-16 DIAGNOSIS — O1414 Severe pre-eclampsia complicating childbirth: Principal | ICD-10-CM | POA: Diagnosis present

## 2018-06-16 DIAGNOSIS — O321XX0 Maternal care for breech presentation, not applicable or unspecified: Secondary | ICD-10-CM | POA: Diagnosis present

## 2018-06-16 DIAGNOSIS — Z87891 Personal history of nicotine dependence: Secondary | ICD-10-CM | POA: Diagnosis not present

## 2018-06-16 DIAGNOSIS — O1413 Severe pre-eclampsia, third trimester: Secondary | ICD-10-CM

## 2018-06-16 DIAGNOSIS — O1494 Unspecified pre-eclampsia, complicating childbirth: Secondary | ICD-10-CM

## 2018-06-16 DIAGNOSIS — Z3A33 33 weeks gestation of pregnancy: Secondary | ICD-10-CM

## 2018-06-16 DIAGNOSIS — Z8759 Personal history of other complications of pregnancy, childbirth and the puerperium: Secondary | ICD-10-CM | POA: Diagnosis present

## 2018-06-16 LAB — COMPREHENSIVE METABOLIC PANEL
ALBUMIN: 2.5 g/dL — AB (ref 3.5–5.0)
ALT: 31 U/L (ref 0–44)
ANION GAP: 8 (ref 5–15)
AST: 25 U/L (ref 15–41)
Alkaline Phosphatase: 85 U/L (ref 38–126)
BUN: 17 mg/dL (ref 6–20)
CHLORIDE: 105 mmol/L (ref 98–111)
CO2: 20 mmol/L — ABNORMAL LOW (ref 22–32)
Calcium: 8.2 mg/dL — ABNORMAL LOW (ref 8.9–10.3)
Creatinine, Ser: 0.79 mg/dL (ref 0.44–1.00)
GFR calc Af Amer: >60 mL/min (ref >60)
Glucose, Bld: 78 mg/dL (ref 70–99)
POTASSIUM: 3.8 mmol/L (ref 3.5–5.1)
Sodium: 133 mmol/L — ABNORMAL LOW (ref 135–145)
Total Bilirubin: 0.6 mg/dL (ref 0.3–1.2)
Total Protein: 5.8 g/dL — ABNORMAL LOW (ref 6.5–8.1)

## 2018-06-16 LAB — CBC
HCT: 32.5 % — ABNORMAL LOW (ref 36.0–46.0)
HEMATOCRIT: 35.5 % — AB (ref 36.0–46.0)
Hemoglobin: 11.1 g/dL — ABNORMAL LOW (ref 12.0–15.0)
Hemoglobin: 12.1 g/dL (ref 12.0–15.0)
MCH: 29.7 pg (ref 26.0–34.0)
MCH: 29.4 pg (ref 26.0–34.0)
MCHC: 34.2 g/dL (ref 30.0–36.0)
MCHC: 34.1 g/dL (ref 30.0–36.0)
MCV: 86.9 fL (ref 78.0–100.0)
MCV: 86.2 fL (ref 78.0–100.0)
PLATELETS: 249 10*3/uL (ref 150–400)
Platelets: 263 10*3/uL (ref 150–400)
RBC: 3.74 MIL/uL — ABNORMAL LOW (ref 3.87–5.11)
RBC: 4.12 MIL/uL (ref 3.87–5.11)
RDW: 12.9 % (ref 11.5–15.5)
RDW: 12.8 % (ref 11.5–15.5)
WBC: 10 10*3/uL (ref 4.0–10.5)
WBC: 11.9 10*3/uL — ABNORMAL HIGH (ref 4.0–10.5)

## 2018-06-16 LAB — URINALYSIS, ROUTINE W REFLEX MICROSCOPIC
BILIRUBIN URINE: NEGATIVE
GLUCOSE, UA: NEGATIVE mg/dL
Ketones, ur: NEGATIVE mg/dL
LEUKOCYTES UA: NEGATIVE
Nitrite: NEGATIVE
Specific Gravity, Urine: 1.03 (ref 1.005–1.030)
pH: 5 (ref 5.0–8.0)

## 2018-06-16 LAB — URIC ACID: Uric Acid, Serum: 4.7 mg/dL (ref 2.5–7.1)

## 2018-06-16 LAB — TYPE AND SCREEN
ABO/RH(D): O POS
ANTIBODY SCREEN: NEGATIVE

## 2018-06-16 LAB — PROTEIN / CREATININE RATIO, URINE
CREATININE, URINE: 301 mg/dL
Protein Creatinine Ratio: 1.84 mg/mg{Cre} — ABNORMAL HIGH (ref 0.00–0.15)
Total Protein, Urine: 554 mg/dL

## 2018-06-16 LAB — ABO/RH: ABO/RH(D): O POS

## 2018-06-16 SURGERY — Surgical Case
Anesthesia: Spinal | Wound class: Clean Contaminated

## 2018-06-16 MED ORDER — MORPHINE SULFATE (PF) 0.5 MG/ML IJ SOLN
INTRAMUSCULAR | Status: AC
  Filled 2018-06-16: qty 10

## 2018-06-16 MED ORDER — LABETALOL HCL 5 MG/ML IV SOLN
20.0000 mg | INTRAVENOUS | Status: AC | PRN
  Administered 2018-06-16: 20 mg via INTRAVENOUS
  Administered 2018-06-16: 80 mg via INTRAVENOUS
  Administered 2018-06-16: 40 mg via INTRAVENOUS
  Filled 2018-06-16: qty 16
  Filled 2018-06-16: qty 4
  Filled 2018-06-16: qty 8

## 2018-06-16 MED ORDER — MORPHINE SULFATE (PF) 0.5 MG/ML IJ SOLN
INTRAMUSCULAR | Status: DC | PRN
  Administered 2018-06-16: .2 mg via EPIDURAL

## 2018-06-16 MED ORDER — OXYTOCIN 40 UNITS IN LACTATED RINGERS INFUSION - SIMPLE MED: 2.5000 [IU]/h | INTRAVENOUS | Status: AC

## 2018-06-16 MED ORDER — DIPHENHYDRAMINE HCL 25 MG PO CAPS: 25.0000 mg | ORAL_CAPSULE | ORAL | Status: DC | PRN

## 2018-06-16 MED ORDER — DOCUSATE SODIUM 100 MG PO CAPS
100.0000 mg | ORAL_CAPSULE | Freq: Every day | ORAL | Status: DC
  Administered 2018-06-17 – 2018-06-21 (×5): 100 mg via ORAL
  Filled 2018-06-16 (×7): qty 1

## 2018-06-16 MED ORDER — SODIUM CHLORIDE 0.9 % IV SOLN
8.0000 mg | Freq: Once | INTRAVENOUS | Status: DC
  Filled 2018-06-16: qty 4

## 2018-06-16 MED ORDER — HYDRALAZINE HCL 20 MG/ML IJ SOLN
10.0000 mg | Freq: Once | INTRAMUSCULAR | Status: DC | PRN
  Filled 2018-06-16 (×2): qty 1

## 2018-06-16 MED ORDER — BUPIVACAINE HCL (PF) 0.5 % IJ SOLN
INTRAMUSCULAR | Status: AC
  Filled 2018-06-16: qty 30

## 2018-06-16 MED ORDER — CEFAZOLIN SODIUM-DEXTROSE 2-4 GM/100ML-% IV SOLN
INTRAVENOUS | Status: AC
  Filled 2018-06-16: qty 100

## 2018-06-16 MED ORDER — HYDRALAZINE HCL 20 MG/ML IJ SOLN
INTRAMUSCULAR | Status: AC
  Filled 2018-06-16: qty 1

## 2018-06-16 MED ORDER — MENTHOL 3 MG MT LOZG: 1.0000 | LOZENGE | OROMUCOSAL | Status: DC | PRN

## 2018-06-16 MED ORDER — DIPHENHYDRAMINE HCL 50 MG/ML IJ SOLN: 12.5000 mg | INTRAMUSCULAR | Status: DC | PRN

## 2018-06-16 MED ORDER — SODIUM CHLORIDE 0.9% FLUSH
3.0000 mL | INTRAVENOUS | Status: DC | PRN
  Administered 2018-06-18: 3 mL via INTRAVENOUS
  Filled 2018-06-16: qty 3

## 2018-06-16 MED ORDER — WITCH HAZEL-GLYCERIN EX PADS: 1.0000 "application " | MEDICATED_PAD | CUTANEOUS | Status: DC | PRN

## 2018-06-16 MED ORDER — NALOXONE HCL 0.4 MG/ML IJ SOLN: 0.4000 mg | INTRAMUSCULAR | Status: DC | PRN

## 2018-06-16 MED ORDER — NALBUPHINE HCL 10 MG/ML IJ SOLN: 5.0000 mg | Freq: Once | INTRAMUSCULAR | Status: DC | PRN

## 2018-06-16 MED ORDER — COCONUT OIL OIL
1.0000 "application " | TOPICAL_OIL | Status: DC | PRN
  Administered 2018-06-17: 1 via TOPICAL
  Filled 2018-06-16: qty 120

## 2018-06-16 MED ORDER — MAGNESIUM SULFATE 40 G IN LACTATED RINGERS - SIMPLE
2.0000 g/h | INTRAVENOUS | Status: DC
  Administered 2018-06-16 – 2018-06-17 (×3): 2 g/h via INTRAVENOUS
  Filled 2018-06-16: qty 40

## 2018-06-16 MED ORDER — SOD CITRATE-CITRIC ACID 500-334 MG/5ML PO SOLN
30.0000 mL | Freq: Once | ORAL | Status: DC
  Filled 2018-06-16: qty 15

## 2018-06-16 MED ORDER — MIDAZOLAM HCL 2 MG/2ML IJ SOLN
INTRAMUSCULAR | Status: DC | PRN
  Administered 2018-06-16 (×2): 1 mg via INTRAVENOUS

## 2018-06-16 MED ORDER — TETANUS-DIPHTH-ACELL PERTUSSIS 5-2.5-18.5 LF-MCG/0.5 IM SUSP
0.5000 mL | Freq: Once | INTRAMUSCULAR | Status: AC
  Administered 2018-06-17: 0.5 mL via INTRAMUSCULAR
  Filled 2018-06-16: qty 0.5

## 2018-06-16 MED ORDER — MAGNESIUM SULFATE BOLUS VIA INFUSION
4.0000 g | Freq: Once | INTRAVENOUS | Status: AC
Start: 2018-06-16 — End: 2018-06-16
  Administered 2018-06-16: 4 g via INTRAVENOUS
  Filled 2018-06-16: qty 500

## 2018-06-16 MED ORDER — ZOLPIDEM TARTRATE 5 MG PO TABS: 5.0000 mg | ORAL_TABLET | Freq: Every evening | ORAL | Status: DC | PRN

## 2018-06-16 MED ORDER — ACETAMINOPHEN 325 MG PO TABS: 650.0000 mg | ORAL_TABLET | ORAL | Status: DC | PRN

## 2018-06-16 MED ORDER — CEFAZOLIN SODIUM-DEXTROSE 2-4 GM/100ML-% IV SOLN
2.0000 g | INTRAVENOUS | Status: AC
  Administered 2018-06-16: 2 g via INTRAVENOUS
  Filled 2018-06-16: qty 100

## 2018-06-16 MED ORDER — ONDANSETRON HCL 4 MG/2ML IJ SOLN: 4.0000 mg | Freq: Three times a day (TID) | INTRAMUSCULAR | Status: DC | PRN

## 2018-06-16 MED ORDER — HYDRALAZINE HCL 20 MG/ML IJ SOLN: 10.0000 mg | Freq: Once | INTRAMUSCULAR | Status: DC | PRN

## 2018-06-16 MED ORDER — DIBUCAINE 1 % RE OINT: 1.0000 "application " | TOPICAL_OINTMENT | RECTAL | Status: DC | PRN

## 2018-06-16 MED ORDER — DEXAMETHASONE SODIUM PHOSPHATE 10 MG/ML IJ SOLN
INTRAMUSCULAR | Status: DC | PRN
  Administered 2018-06-16: 4 mg via INTRAVENOUS

## 2018-06-16 MED ORDER — PHENYLEPHRINE 8 MG IN D5W 100 ML (0.08MG/ML) PREMIX OPTIME
INJECTION | INTRAVENOUS | Status: AC
  Filled 2018-06-16: qty 100

## 2018-06-16 MED ORDER — LACTATED RINGERS IV SOLN
INTRAVENOUS | Status: DC | PRN
  Administered 2018-06-16: 17:00:00 via INTRAVENOUS

## 2018-06-16 MED ORDER — MAGNESIUM SULFATE 40 G IN LACTATED RINGERS - SIMPLE
2.0000 g/h | INTRAVENOUS | Status: AC
  Filled 2018-06-16: qty 500

## 2018-06-16 MED ORDER — LACTATED RINGERS IV SOLN: INTRAVENOUS | Status: DC

## 2018-06-16 MED ORDER — BUPIVACAINE IN DEXTROSE 0.75-8.25 % IT SOLN
INTRATHECAL | Status: DC | PRN
  Administered 2018-06-16: 1.6 mL via INTRATHECAL

## 2018-06-16 MED ORDER — HYDROMORPHONE HCL 1 MG/ML IJ SOLN: 1.0000 mg | INTRAMUSCULAR | Status: DC | PRN

## 2018-06-16 MED ORDER — OXYCODONE-ACETAMINOPHEN 5-325 MG PO TABS
1.0000 | ORAL_TABLET | ORAL | Status: DC | PRN
  Administered 2018-06-17 – 2018-06-18 (×2): 1 via ORAL
  Filled 2018-06-16 (×2): qty 1

## 2018-06-16 MED ORDER — MEPERIDINE HCL 25 MG/ML IJ SOLN: 6.2500 mg | INTRAMUSCULAR | Status: DC | PRN

## 2018-06-16 MED ORDER — CALCIUM CARBONATE ANTACID 500 MG PO CHEW: 2.0000 | CHEWABLE_TABLET | ORAL | Status: DC | PRN

## 2018-06-16 MED ORDER — SCOPOLAMINE 1 MG/3DAYS TD PT72
MEDICATED_PATCH | TRANSDERMAL | Status: AC
  Filled 2018-06-16: qty 1

## 2018-06-16 MED ORDER — ONDANSETRON HCL 4 MG/2ML IJ SOLN
INTRAMUSCULAR | Status: AC
  Filled 2018-06-16: qty 2

## 2018-06-16 MED ORDER — LACTATED RINGERS IV SOLN
INTRAVENOUS | Status: DC
  Administered 2018-06-16 – 2018-06-17 (×3): via INTRAVENOUS

## 2018-06-16 MED ORDER — PRENATAL MULTIVITAMIN CH
1.0000 | ORAL_TABLET | Freq: Every day | ORAL | Status: DC
  Administered 2018-06-18 – 2018-06-20 (×3): 1 via ORAL
  Filled 2018-06-16 (×4): qty 1

## 2018-06-16 MED ORDER — NALBUPHINE HCL 10 MG/ML IJ SOLN: 5.0000 mg | INTRAMUSCULAR | Status: DC | PRN

## 2018-06-16 MED ORDER — PRENATAL MULTIVITAMIN CH: 1.0000 | ORAL_TABLET | Freq: Every day | ORAL | Status: DC

## 2018-06-16 MED ORDER — DIPHENHYDRAMINE HCL 25 MG PO CAPS: 25.0000 mg | ORAL_CAPSULE | Freq: Four times a day (QID) | ORAL | Status: DC | PRN

## 2018-06-16 MED ORDER — SCOPOLAMINE 1 MG/3DAYS TD PT72
MEDICATED_PATCH | TRANSDERMAL | Status: DC | PRN
  Administered 2018-06-16: 1 via TRANSDERMAL

## 2018-06-16 MED ORDER — LABETALOL HCL 5 MG/ML IV SOLN
20.0000 mg | INTRAVENOUS | Status: AC | PRN
  Administered 2018-06-16: 80 mg via INTRAVENOUS
  Administered 2018-06-16: 20 mg via INTRAVENOUS
  Administered 2018-06-16: 40 mg via INTRAVENOUS
  Filled 2018-06-16: qty 16
  Filled 2018-06-16: qty 4
  Filled 2018-06-16: qty 8

## 2018-06-16 MED ORDER — LABETALOL HCL 200 MG PO TABS
200.0000 mg | ORAL_TABLET | Freq: Three times a day (TID) | ORAL | Status: DC
  Administered 2018-06-16 – 2018-06-18 (×8): 200 mg via ORAL
  Filled 2018-06-16 (×8): qty 1

## 2018-06-16 MED ORDER — MIDAZOLAM HCL 2 MG/2ML IJ SOLN
INTRAMUSCULAR | Status: AC
  Filled 2018-06-16: qty 2

## 2018-06-16 MED ORDER — NALBUPHINE HCL 10 MG/ML IJ SOLN
5.0000 mg | INTRAMUSCULAR | Status: DC | PRN
  Administered 2018-06-16: 5 mg via INTRAVENOUS
  Filled 2018-06-16: qty 1

## 2018-06-16 MED ORDER — BUPIVACAINE HCL (PF) 0.5 % IJ SOLN
INTRAMUSCULAR | Status: DC | PRN
  Administered 2018-06-16: 30 mL

## 2018-06-16 MED ORDER — NALOXONE HCL 4 MG/10ML IJ SOLN
1.0000 ug/kg/h | INTRAVENOUS | Status: DC | PRN
  Filled 2018-06-16: qty 5

## 2018-06-16 MED ORDER — SIMETHICONE 80 MG PO CHEW
80.0000 mg | CHEWABLE_TABLET | ORAL | Status: DC
  Administered 2018-06-18 – 2018-06-21 (×4): 80 mg via ORAL
  Filled 2018-06-16 (×5): qty 1

## 2018-06-16 MED ORDER — FENTANYL CITRATE (PF) 100 MCG/2ML IJ SOLN
INTRAMUSCULAR | Status: DC | PRN
  Administered 2018-06-16: 10 ug via INTRATHECAL

## 2018-06-16 MED ORDER — PROMETHAZINE HCL 25 MG/ML IJ SOLN
6.2500 mg | INTRAMUSCULAR | Status: DC | PRN
  Administered 2018-06-16: 12.5 mg via INTRAVENOUS

## 2018-06-16 MED ORDER — SOD CITRATE-CITRIC ACID 500-334 MG/5ML PO SOLN
ORAL | Status: AC
  Filled 2018-06-16: qty 15

## 2018-06-16 MED ORDER — OXYTOCIN 10 UNIT/ML IJ SOLN
INTRAVENOUS | Status: DC | PRN
  Administered 2018-06-16: 40 [IU] via INTRAVENOUS

## 2018-06-16 MED ORDER — HYDRALAZINE HCL 20 MG/ML IJ SOLN
10.0000 mg | Freq: Once | INTRAMUSCULAR | Status: AC | PRN
  Administered 2018-06-16: 10 mg via INTRAVENOUS

## 2018-06-16 MED ORDER — HYDRALAZINE HCL 20 MG/ML IJ SOLN
5.0000 mg | INTRAMUSCULAR | Status: DC | PRN
  Administered 2018-06-16: 5 mg via INTRAVENOUS
  Filled 2018-06-16: qty 1

## 2018-06-16 MED ORDER — FENTANYL CITRATE (PF) 100 MCG/2ML IJ SOLN: 25.0000 ug | INTRAMUSCULAR | Status: DC | PRN

## 2018-06-16 MED ORDER — SCOPOLAMINE 1 MG/3DAYS TD PT72
1.0000 | MEDICATED_PATCH | Freq: Once | TRANSDERMAL | Status: DC
  Filled 2018-06-16: qty 1

## 2018-06-16 MED ORDER — SIMETHICONE 80 MG PO CHEW
80.0000 mg | CHEWABLE_TABLET | Freq: Three times a day (TID) | ORAL | Status: DC
  Administered 2018-06-17 – 2018-06-21 (×13): 80 mg via ORAL
  Filled 2018-06-16 (×13): qty 1

## 2018-06-16 MED ORDER — SENNOSIDES-DOCUSATE SODIUM 8.6-50 MG PO TABS
2.0000 | ORAL_TABLET | ORAL | Status: DC
  Administered 2018-06-18 – 2018-06-21 (×4): 2 via ORAL
  Filled 2018-06-16 (×4): qty 2

## 2018-06-16 MED ORDER — ONDANSETRON HCL 4 MG/2ML IJ SOLN
INTRAMUSCULAR | Status: DC | PRN
  Administered 2018-06-16: 4 mg via INTRAVENOUS

## 2018-06-16 MED ORDER — SODIUM CHLORIDE 0.9 % IV SOLN: 8.0000 mg | Freq: Once | INTRAVENOUS | Status: DC

## 2018-06-16 MED ORDER — DEXAMETHASONE SODIUM PHOSPHATE 4 MG/ML IJ SOLN
INTRAMUSCULAR | Status: AC
  Filled 2018-06-16: qty 1

## 2018-06-16 MED ORDER — SIMETHICONE 80 MG PO CHEW: 80.0000 mg | CHEWABLE_TABLET | ORAL | Status: DC | PRN

## 2018-06-16 MED ORDER — OXYTOCIN 10 UNIT/ML IJ SOLN
INTRAMUSCULAR | Status: AC
  Filled 2018-06-16: qty 4

## 2018-06-16 MED ORDER — LABETALOL HCL 5 MG/ML IV SOLN
20.0000 mg | INTRAVENOUS | Status: AC | PRN
  Administered 2018-06-16: 80 mg via INTRAVENOUS
  Administered 2018-06-16: 40 mg via INTRAVENOUS
  Administered 2018-06-16: 20 mg via INTRAVENOUS
  Filled 2018-06-16: qty 8
  Filled 2018-06-16: qty 16
  Filled 2018-06-16 (×2): qty 4

## 2018-06-16 MED ORDER — FENTANYL CITRATE (PF) 100 MCG/2ML IJ SOLN
INTRAMUSCULAR | Status: AC
  Filled 2018-06-16: qty 2

## 2018-06-16 MED ORDER — PROMETHAZINE HCL 25 MG/ML IJ SOLN
INTRAMUSCULAR | Status: AC
  Filled 2018-06-16: qty 1

## 2018-06-16 MED ORDER — OXYCODONE-ACETAMINOPHEN 5-325 MG PO TABS
2.0000 | ORAL_TABLET | ORAL | Status: DC | PRN
  Administered 2018-06-17 – 2018-06-18 (×2): 2 via ORAL
  Filled 2018-06-16 (×3): qty 2

## 2018-06-16 MED ORDER — PHENYLEPHRINE 8 MG IN D5W 100 ML (0.08MG/ML) PREMIX OPTIME
INJECTION | INTRAVENOUS | Status: DC | PRN
  Administered 2018-06-16: 60 ug/min via INTRAVENOUS

## 2018-06-16 SURGICAL SUPPLY — 39 items
BAG DECANTER FOR FLEXI CONT (MISCELLANEOUS) ×3 IMPLANT
BENZOIN TINCTURE PRP APPL 2/3 (GAUZE/BANDAGES/DRESSINGS) ×3 IMPLANT
CHLORAPREP W/TINT 26ML (MISCELLANEOUS) ×3 IMPLANT
CLAMP CORD UMBIL (MISCELLANEOUS) IMPLANT
CLOSURE WOUND 1/2 X4 (GAUZE/BANDAGES/DRESSINGS) ×1
CLOTH BEACON ORANGE TIMEOUT ST (SAFETY) ×3 IMPLANT
DRSG OPSITE POSTOP 4X10 (GAUZE/BANDAGES/DRESSINGS) ×3 IMPLANT
ELECT REM PT RETURN 9FT ADLT (ELECTROSURGICAL) ×3
ELECTRODE REM PT RTRN 9FT ADLT (ELECTROSURGICAL) ×1 IMPLANT
EXTRACTOR VACUUM BELL STYLE (SUCTIONS) IMPLANT
EXTRACTOR VACUUM KIWI (MISCELLANEOUS) IMPLANT
GAUZE SPONGE 4X4 12PLY STRL LF (GAUZE/BANDAGES/DRESSINGS) ×6 IMPLANT
GLOVE BIO SURGEON STRL SZ 6.5 (GLOVE) ×2 IMPLANT
GLOVE BIO SURGEONS STRL SZ 6.5 (GLOVE) ×1
GLOVE BIOGEL PI IND STRL 7.0 (GLOVE) ×2 IMPLANT
GLOVE BIOGEL PI INDICATOR 7.0 (GLOVE) ×4
GOWN STRL REUS W/TWL LRG LVL3 (GOWN DISPOSABLE) ×9 IMPLANT
KIT ABG SYR 3ML LUER SLIP (SYRINGE) IMPLANT
NEEDLE HYPO 22GX1.5 SAFETY (NEEDLE) IMPLANT
NEEDLE HYPO 25X5/8 SAFETYGLIDE (NEEDLE) IMPLANT
NS IRRIG 1000ML POUR BTL (IV SOLUTION) ×3 IMPLANT
PACK C SECTION WH (CUSTOM PROCEDURE TRAY) ×3 IMPLANT
PAD ABD 7.5X8 STRL (GAUZE/BANDAGES/DRESSINGS) ×3 IMPLANT
PAD OB MATERNITY 4.3X12.25 (PERSONAL CARE ITEMS) ×3 IMPLANT
PENCIL SMOKE EVAC W/HOLSTER (ELECTROSURGICAL) ×3 IMPLANT
RTRCTR C-SECT PINK 25CM LRG (MISCELLANEOUS) ×3 IMPLANT
STRIP CLOSURE SKIN 1/2X4 (GAUZE/BANDAGES/DRESSINGS) ×2 IMPLANT
SUT CHROMIC 2 0 CT 1 (SUTURE) ×6 IMPLANT
SUT MNCRL 0 VIOLET CTX 36 (SUTURE) ×2 IMPLANT
SUT MONOCRYL 0 CTX 36 (SUTURE) ×4
SUT PDS AB 0 CTX 36 PDP370T (SUTURE) IMPLANT
SUT PLAIN 2 0 (SUTURE)
SUT PLAIN ABS 2-0 CT1 27XMFL (SUTURE) IMPLANT
SUT VIC AB 0 CTX 36 (SUTURE) ×4
SUT VIC AB 0 CTX36XBRD ANBCTRL (SUTURE) ×2 IMPLANT
SUT VIC AB 4-0 KS 27 (SUTURE) ×3 IMPLANT
SYR CONTROL 10ML LL (SYRINGE) IMPLANT
TOWEL OR 17X24 6PK STRL BLUE (TOWEL DISPOSABLE) ×3 IMPLANT
TRAY FOLEY W/BAG SLVR 14FR LF (SET/KITS/TRAYS/PACK) ×3 IMPLANT

## 2018-06-16 NOTE — MAU Note (Signed)
Sent over from dr for her BP.Marland Kitchen. Not a new problem.  Denies HA, visual changes, epigastric pain or increased swelling. No pain, bleeding or leaking.

## 2018-06-16 NOTE — Anesthesia Postprocedure Evaluation (Signed)
Anesthesia Post Note  Patient: Kelsey Fitzgerald, siteTakaiya Mcguirk  Procedure(s) Performed: CESAREAN SECTION (N/A )     Patient location during evaluation: PACU Anesthesia Type: Spinal Level of consciousness: oriented and awake and alert Pain management: pain level controlled Vital Signs Assessment: post-procedure vital signs reviewed and stable Respiratory status: spontaneous breathing, respiratory function stable and patient connected to nasal cannula oxygen Cardiovascular status: blood pressure returned to baseline and stable Postop Assessment: no headache, no backache, no apparent nausea or vomiting and spinal receding Anesthetic complications: no    Last Vitals:  Vitals:   06/16/18 1930 06/16/18 1945  BP: (!) 154/92 (!) 150/97  Pulse: 75 84  Resp: 15 19  Temp:  36.4 C  SpO2: 98% 98%    Last Pain:  Vitals:   06/16/18 1945  TempSrc: Axillary  PainSc: 0-No pain   Pain Goal:                 Shelton SilvasKevin D Cortnee Steinmiller

## 2018-06-16 NOTE — Progress Notes (Signed)
Dr. Mora ApplPinn at bedside to discuss potential c/s. Pt deferring consent til she can talk to her family. Pt to inform nurse when she makes that decision.

## 2018-06-16 NOTE — H&P (Signed)
Kelsey Fitzgerald is a 27 y.o. female, G3P1011, IUP at 33.6 weeks, presenting at first to the MAU for high BP at [facility] PNV today. Pt was referred here. Then pts BP were in severe ranges and pt now dx with preeclampsia with severe features. Pt resting quietly in room with daughter at bedside.  Pt endorse + Fm. Denies vaginal leakage. Denies vaginal bleeding. Denies feeling cxt's. H/O trich on pap 02/24/2018.  Patient Active Problem List   Diagnosis Date Noted  . Preeclampsia, severe, third trimester 06/16/2018    Prenatal Problem: Group B Streptococcus carrier (Positive on urine at 25 weeks--Rx Pen VK, TOC after treatment negative, treat in labor.) urogenital infection by Trichomonas vaginalis (Noted on pap 03/03/18, negative TOC 5/6 visit) varicella non-immune (For vaccination postpartum.) pre-eclampsia (Dx 06/09/18 IOL 37 weeks APFT weekly) drug allergy (Ibuprophen causes itching) fetal pyelectasis (Bilateral, 5 mm (lower end of range)--recheck at 28 weeks.)  Prenatal meds: Medications Prior to Admission  Medication Sig Dispense Refill Last Dose  . calcium carbonate (TUMS - DOSED IN MG ELEMENTAL CALCIUM) 500 MG chewable tablet Chew 1 tablet by mouth 2 (two) times daily as needed for indigestion or heartburn.   Past Week at Unknown time    Past Medical History:  Diagnosis Date  . Pregnancy induced hypertension      No current facility-administered medications on file prior to encounter.    Current Outpatient Medications on File Prior to Encounter  Medication Sig Dispense Refill  . calcium carbonate (TUMS - DOSED IN MG ELEMENTAL CALCIUM) 500 MG chewable tablet Chew 1 tablet by mouth 2 (two) times daily as needed for indigestion or heartburn.       Allergies  Allergen Reactions  . Ibuprofen Hives  . Lactose Intolerance (Gi) Nausea And Vomiting    History of present pregnancy: Pt Info/Preference:  Screening/Consents:  Labs:   EDD: Estimated Date of Delivery: 07/29/18  Establised:  Patient's last menstrual period was 10/22/2017.  Anatomy Scan: Date: 03/22/2018 Placenta Location: right lateral  Genetic Screen: Panoroma:declined AFP:  First Tri: Quad:  Office: [facility]            First PNV: 14.5 weeks Blood Type    Language: English Last PNV: 33.6 weeks Rhogam    Flu Vaccine:  Declined   Antibody    TDaP vaccine Declined   GTT: Early: +1 hour Third Trimester: -3hour  Feeding Plan: Breast BTL: No Rubella:  immune  Contraception: Undecided VBAC: No RPR:   non-reactive  Circumcision: ???   HBsAg:  Neg  Pediatrician:  ???   HIV:   non-reactive   Prenatal Classes: No Additional [place]: Growth see below GBS: Positive in urine  (For PCN allergy, check sensitivities)       Chlamydia: Neg    MFM Referral/Consult:  GC: Neg  Support Person: Not here now   PAP: 2019  Pain Management: ??? Neonatologist Referral:  Hgb Electrophoresis:  AA  Birth Plan: No   Hgb NOB: 14.3    28W: Will draw here  Anatomy exam 03/22/2018:   Growth [place] 05/19/2018  OB History    Gravida  3   Para  1   Term  1   Preterm      AB  1   Living  1     SAB  1   TAB      Ectopic      Multiple      Live Births  1  Past Medical History:  Diagnosis Date  . Pregnancy induced hypertension    Past Surgical History:  Procedure Laterality Date  . NO PAST SURGERIES     Family History: family history is not on file. Social History:  reports that she has quit smoking. She has never used smokeless tobacco. She reports that she drank alcohol. She reports that she does not use drugs.   Prenatal Transfer Tool  Maternal Diabetes: No Genetic Screening: Declined Maternal Ultrasounds/Referrals: Normal Fetal Ultrasounds or other Referrals:  Other:  Mild ptyalectasis Maternal Substance Abuse:  No Significant Maternal Medications:  None Significant Maternal Lab Results: None  ROS:  Review of Systems  All other systems reviewed and are negative.    Physical Exam: BP (!) 180/103    Pulse 63   Temp 98.1 F (36.7 C)   Resp 18   Wt 85.7 kg (189 lb)   LMP 10/22/2017   SpO2 98%   BMI 29.16 kg/m   Physical Exam  Constitutional: She is well-developed, well-nourished, and in no distress.  HENT:  Head: Normocephalic and atraumatic.  Eyes: Pupils are equal, round, and reactive to light.  Neck: Normal range of motion. Neck supple.  Cardiovascular: Normal rate and regular rhythm.  Pulmonary/Chest: Effort normal and breath sounds normal.  Abdominal: Soft. Bowel sounds are normal.  Genitourinary: Cervix normal.  Genitourinary Comments: SVE: deferred Uterus: gravida equal to date, soft non-tender.   Musculoskeletal: Normal range of motion. She exhibits edema.  2+ pitting edema to shin-feet bilaterally   Neurological: She is alert. She has normal reflexes. Gait normal.  Skin: Skin is warm and dry.  Psychiatric: Affect normal.  Nursing note and vitals reviewed.    NST: FHR baseline 130s bpm, Variability: moderate, Accelerations:present, Decelerations:  Absent= Cat 1/Reactive UC:   none, irritability  SVE:    , Deferred    Labs: Results for orders placed or performed during the hospital encounter of 06/16/18 (from the past 24 hour(s))  Urinalysis, Routine w reflex microscopic     Status: Abnormal   Collection Time: 06/16/18 11:52 AM  Result Value Ref Range   Color, Urine YELLOW YELLOW   APPearance CLOUDY (A) CLEAR   Specific Gravity, Urine 1.030 1.005 - 1.030   pH 5.0 5.0 - 8.0   Glucose, UA NEGATIVE NEGATIVE mg/dL   Hgb urine dipstick SMALL (A) NEGATIVE   Bilirubin Urine NEGATIVE NEGATIVE   Ketones, ur NEGATIVE NEGATIVE mg/dL   Protein, ur >=161 (A) NEGATIVE mg/dL   Nitrite NEGATIVE NEGATIVE   Leukocytes, UA NEGATIVE NEGATIVE   RBC / HPF 6-10 0 - 5 RBC/hpf   WBC, UA 6-10 0 - 5 WBC/hpf   Bacteria, UA RARE (A) NONE SEEN   Squamous Epithelial / LPF 11-20 0 - 5   Mucus PRESENT   Protein / creatinine ratio, urine     Status: Abnormal   Collection Time: 06/16/18  11:52 AM  Result Value Ref Range   Creatinine, Urine 301.00 mg/dL   Total Protein, Urine 554 mg/dL   Protein Creatinine Ratio 1.84 (H) 0.00 - 0.15 mg/mg[Cre]  CBC     Status: Abnormal   Collection Time: 06/16/18 12:16 PM  Result Value Ref Range   WBC 10.0 4.0 - 10.5 K/uL   RBC 3.74 (L) 3.87 - 5.11 MIL/uL   Hemoglobin 11.1 (L) 12.0 - 15.0 g/dL   HCT 09.6 (L) 04.5 - 40.9 %   MCV 86.9 78.0 - 100.0 fL   MCH 29.7 26.0 - 34.0 pg  MCHC 34.2 30.0 - 36.0 g/dL   RDW 40.9 81.1 - 91.4 %   Platelets 249 150 - 400 K/uL  Comprehensive metabolic panel     Status: Abnormal   Collection Time: 06/16/18 12:16 PM  Result Value Ref Range   Sodium 133 (L) 135 - 145 mmol/L   Potassium 3.8 3.5 - 5.1 mmol/L   Chloride 105 98 - 111 mmol/L   CO2 20 (L) 22 - 32 mmol/L   Glucose, Bld 78 70 - 99 mg/dL   BUN 17 6 - 20 mg/dL   Creatinine, Ser 7.82 0.44 - 1.00 mg/dL   Calcium 8.2 (L) 8.9 - 10.3 mg/dL   Total Protein 5.8 (L) 6.5 - 8.1 g/dL   Albumin 2.5 (L) 3.5 - 5.0 g/dL   AST 25 15 - 41 U/L   ALT 31 0 - 44 U/L   Alkaline Phosphatase 85 38 - 126 U/L   Total Bilirubin 0.6 0.3 - 1.2 mg/dL   GFR calc non Af Amer >60 >60 mL/min   GFR calc Af Amer >60 >60 mL/min   Anion gap 8 5 - 15  Uric acid     Status: None   Collection Time: 06/16/18 12:16 PM  Result Value Ref Range   Uric Acid, Serum 4.7 2.5 - 7.1 mg/dL    Imaging:  No results found.  MAU Course: Orders Placed This Encounter  Procedures  . Urinalysis, Routine w reflex microscopic  . CBC  . Comprehensive metabolic panel  . Uric acid  . Protein / creatinine ratio, urine  . Comprehensive metabolic panel  . CBC with Differential  . Uric acid  . Diet clear liquid Room service appropriate? Yes; Fluid consistency: Thin  . Fetal non-stress test  . Vital signs  . Check blood pressure 20 minutes after giving hydrALAZINE 10 mg IV dose. Call MD if SBP >/= 160 and/or DBP >/= 110.  . Once BP goal is reached, repeat BP every 10 minutes for 1 hour, then  every 15 minutes for 1 hours, then per policy for antepartum labor or post-partum.  . Vital signs  . Notify Physician  . Fetal monitoring  . Intake and output  . Check blood pressure 20 minutes after giving hydrALAZINE 10 mg IV dose. Call MD if SBP >/= 160 and/or DBP >/= 110.  . Once BP goal is reached, repeat BP every 10 minutes for 1 hour, then every 15 minutes for 1 hours, then per policy for antepartum labor or post-partum.  Marland Kitchen Notify physician (specify)  . Vital signs  . Defer vaginal exam for vaginal bleeding or PROM <37 weeks  . Initiate Oral Care Protocol  . Initiate Carrier Fluid Protocol  . SCDs  . Practitioner attestation of consent  . Full code  . Pulse oximetry check with vital signs  . Type and screen [facility] [place]  . Insert peripheral IV  . Admit to Inpatient (patient's expected length of stay will be greater than 2 midnights or inpatient only procedure)   Meds ordered this encounter  Medications  . labetalol (NORMODYNE,TRANDATE) injection 20-80 mg  . hydrALAZINE (APRESOLINE) injection 10 mg  . labetalol (NORMODYNE,TRANDATE) injection 20-80 mg  . hydrALAZINE (APRESOLINE) injection 10 mg  . magnesium bolus via infusion 4 g  . magnesium sulfate 40 grams in LR 500 mL OB infusion  . acetaminophen (TYLENOL) tablet 650 mg  . zolpidem (AMBIEN) tablet 5 mg  . docusate sodium (COLACE) capsule 100 mg  . calcium carbonate (TUMS -  dosed in mg elemental calcium) chewable tablet 400 mg of elemental calcium  . prenatal multivitamin tablet 1 tablet  . lactated ringers infusion    * PI[facility] labs results from 06/09/2018 Labs: WBC/Hgb/Hct/Plts: 8.8/11.6/34.1/274 (06/26 1545) BUN/Cr/glu/ALT/AST/amyl/lip: 10/0.61/--/15/22/--/-- (06/26 1545) Na/K/Cl/CO2: 132/4.0/104/21 (06/26 1545) No results found for this or any previous visit (from the past 720 hour(s)).     Assessment/Plan: Kelsey Fitzgerald is a 27 y.o. female, G3P1011, IUP at 33.6 weeks, presenting for  admission to antepartum for magnesium and monitoring of preeclampsia with severe features. .   FWB: Cat 1 Fetal Tracing.   Plan: Ad[facility] to [facility] per consult with Dr Mora ApplPinn Routine [facility] orders Continuous fetal monitoring. Betamethasone complete on 06/27 Preeclampsia with severe features: Labs pending,  start mag 4g loading/2g/hr maintinence, strict IO, neuro Q4 checks, labetalol protocol.  Pain med/epidural prn GBS: +in urine, will tx with penicillin if induced.  MFM consult   Dr. Mora ApplPinn was in the room during assessment and verbalized plan of care.   Mistina Coatney NP-C, CNM, MSN 06/16/2018, 1:52 PM

## 2018-06-16 NOTE — MAU Note (Signed)
Urine in lab 

## 2018-06-16 NOTE — Transfer of Care (Signed)
Immediate Anesthesia Transfer of Care Note  Patient: Engineer, siteTakaiya Rennie  Procedure(s) Performed: CESAREAN SECTION (N/A )  Patient Location: PACU  Anesthesia Type:Spinal  Level of Consciousness: awake, alert  and oriented  Airway & Oxygen Therapy: Patient Spontanous Breathing  Post-op Assessment: Report given to RN and Post -op Vital signs reviewed and stable  Post vital signs: Reviewed  Last Vitals:  Vitals Value Taken Time  BP    Temp    Pulse    Resp    SpO2      Last Pain:  Vitals:   06/16/18 1350  TempSrc:   PainSc: 0-No pain         Complications: No apparent anesthesia complications

## 2018-06-16 NOTE — Op Note (Signed)
Cesarean Section Procedure Note  Kelsey Fitzgerald  DOB:    01/21/1991  MRN:    [id number]  Date of Surgery:  06/16/2018  Indication: 33 week 6 day  [facility] admitted for severe range blood pressures and placed on magnesium sulfate for seizure prophylaxis.  Patient's blood pressure resistant to all intravenous antihypertensives. Patient consented to proceed with a primary cesarean delivery.    Pre-operative Diagnosis: Preeclampsia with severe features  Post-operative Diagnosis: same plus Frank breech presentation  Procedure:  Primary cesarean delivery                         Surgeon: Wynonia HazardPINN, Olaf Mesa [name], MD  Assistants: Dale DurhamMONTANA, JADE, CNM  Anesthesia: Spinal anesthesia  ASA Class: 2  Procedure Details   The patient was counseled about the risks, benefits, complications of the cesarean section. The patient concurred with the proposed plan, giving informed consent.  The site of surgery properly noted/marked. The patient was taken to Operating Room # 9, identified as Kelsey Fitzgerald, Kelsey Fitzgerald and the procedure verified as C-Section Delivery. A Time Out was held and the above information confirmed.  After the spinal was found to adequate, the patient was placed in the dorsal supine position with a leftward tilt, draped and prepped in the usual sterile manner. A Pfannenstiel incision was made with a 10 blade scalpel and the incision carried down through the subcutaneous tissue to the fascia.  The fascia was incised in the midline and the fascial incision was extended laterally with Mayo scissors. The superior aspect of the fascial incision was grasped with Coker clamps x2, tented up and the rectus muscles dissected off sharply with the bovie.  The rectus was then dissected off with blunt dissection and the bovie inferiorly. The rectus muscles were separated in the midline. The abdominal peritoneum was identified, and bluntly entered using surgeons fingers. The peritoneal opening was bluntly extended with  gentle pulling.   The Alexis retractor was then deployed. The vesicouterine peritoneum was identified, tented up, entered sharply with Metzenbaum scissors, and the bladder flap was created digitally. Scalpel was then used to make a low transverse incision on the uterus which was extended laterally with  blunt dissection. The fetal breech was identified, each leg was delivered  The body was delivered until the shoulders.  Each arm was delivered via Pinard maneuver. The head was delivered via Domenic Schwab[place], [place], [place] maneuver. The A live female infant was bulb suctioned on the operative field cried vigorously, cord was clamped and cut and the infant was passed to the waiting neonatologist. Apgars 8/8. Cord gases (venous) and cord blood was obtained for evaluation. The placenta was spontaneously removed intact. The placenta was handed off to be sent as a specimen for Pathology.   The uterus was cleared of all clot and debris. The uterine incision was repaired with #0 Monocryl in running locked fashion. A second imbricating suture was performed using the same suture. The incision was hemostatic. Ovaries and tubes were inspected and normal. The Alexis retractor was removed. The abdominal cavity was cleared of all clot and debris. The abdominal peritoneum was reapproximated with 2-0 chromic  in a running fashion, the rectus muscles was reapproximated with #2 chromic in interrupted fashion. The fascia was closed with 0 Vicryl in a running fashion. The subcuticular layer was irrigated and all bleeders cauterized.  30 mL of 0.5% Marcaine was injected into the subcutaneous layer.    The skin was closed with 4-0 vicryl in a  subcuticular fashion using a Mellody Dance needle. The incision was dressed with benzoine, steri strips and pressure dressing. All sponge lap and needle counts were correct x3.   Patient tolerated the procedure well and recovered in stable condition following the procedure.  Instrument, sponge, and needle  counts were correct prior the abdominal closure and at the conclusion of the case.   Findings: Live female infant, Apgars 8/8, clear amniotic fluid, placenta small but normal in appearance, normal uterus, bilateral tubes and ovaries  Estimated Blood Loss:  IVF:  600 mL LR         Drains: Foley catheter  Urine output: 200 mL clear         Specimens: Placenta to Pathology         Implants: none         Complications:  None; patient tolerated the procedure well.         Disposition: PACU - hemodynamically stable.   Ermina Oberman [name]

## 2018-06-16 NOTE — Anesthesia Procedure Notes (Signed)
Spinal  Patient location during procedure: OR Start time: 06/16/2018 4:54 PM End time: 06/16/2018 5:04 PM Staffing Anesthesiologist: Heather RobertsSinger, Bell Cai, MD Performed: anesthesiologist and resident/CRNA  Preanesthetic Checklist Completed: patient identified, surgical consent, pre-op evaluation, timeout performed, IV checked, risks and benefits discussed and monitors and equipment checked Spinal Block Patient position: sitting Prep: DuraPrep Patient monitoring: cardiac monitor, continuous pulse ox and blood pressure Approach: midline Location: L2-3 Injection technique: single-shot Needle Needle type: Pencan  Needle gauge: 24 G Needle length: 9 cm Additional Notes Functioning IV was confirmed and monitors were applied. Sterile prep and drape, including hand hygiene and sterile gloves were used. The patient was positioned and the spine was prepped. The skin was anesthetized with lidocaine.  Free flow of clear CSF was obtained prior to injecting local anesthetic into the CSF.  The spinal needle aspirated freely following injection.  The needle was carefully withdrawn.  The patient tolerated the procedure well.

## 2018-06-16 NOTE — Anesthesia Preprocedure Evaluation (Addendum)
Anesthesia Evaluation  Patient identified by MRN, date of birth, ID band Patient awake    Reviewed: Allergy & Precautions, NPO status , Patient's Chart, lab work & pertinent test results  History of Anesthesia Complications Negative for: history of anesthetic complications  Airway Mallampati: II  TM Distance: >3 FB     Dental no notable dental hx. (+) Dental Advisory Given   Pulmonary former smoker,    Pulmonary exam normal        Cardiovascular hypertension, Normal cardiovascular exam     Neuro/Psych negative neurological ROS  negative psych ROS   GI/Hepatic negative GI ROS, Neg liver ROS,   Endo/Other  negative endocrine ROS  Renal/GU negative Renal ROS  negative genitourinary   Musculoskeletal negative musculoskeletal ROS (+)   Abdominal   Peds negative pediatric ROS (+)  Hematology negative hematology ROS (+)   Anesthesia Other Findings   Reproductive/Obstetrics (+) Pregnancy                            Anesthesia Physical Anesthesia Plan  ASA: III and emergent  Anesthesia Plan: Spinal   Post-op Pain Management:    Induction:   PONV Risk Score and Plan: 3 and Ondansetron, Scopolamine patch - Pre-op and Treatment may vary due to age or medical condition  Airway Management Planned: Natural Airway  Additional Equipment:   Intra-op Plan:   Post-operative Plan:   Informed Consent: I have reviewed the patients History and Physical, chart, labs and discussed the procedure including the risks, benefits and alternatives for the proposed anesthesia with the patient or authorized representative who has indicated his/her understanding and acceptance.   Dental advisory given  Plan Discussed with: CRNA, Anesthesiologist and Surgeon  Anesthesia Plan Comments:        Anesthesia Quick Evaluation

## 2018-06-16 NOTE — Progress Notes (Signed)
MD PROGRESS NOTE  Name: Kelsey Fitzgerald Medical Record Number:  [id number] Date of Birth: [birth year] Date of Service: 06/16/2018  27 y.o. G3P1011 [id number] HD#0 admitted for 33 weeks 6 days preeclampsia with severe features.  Patient seen and examined at bedside for persistent severe range BPs Patient c/o nausea and vomiting.  She denies scotomata, no visual changes, no headache,  No RUQ pain.   The patient's past medical history and prenatal records were reviewed.  Additional issues addressed and updated today: Patient Active Problem List   Diagnosis Date Noted  . Preeclampsia, severe, third trimester 06/16/2018    Physical Examination:   Vitals:   06/16/18 1441 06/16/18 1451  BP: (!) 166/92 (!) 174/94  Pulse: 89 99  Resp:    Temp:    SpO2:     General appearance - alert, well appearing, and in no distress and oriented to person, place, and time Mental status - alert, oriented to person, place, and time  Abd  Soft, gravid, nontender Ex SCDs FHTs  150s, moderate variability accels no decels Toco  none  Cervix: External os finger tip, internal os closed, long / thick/ posterior  Results for orders placed or performed during the hospital encounter of 06/16/18 (from the past 24 hour(s))  Urinalysis, Routine w reflex microscopic     Status: Abnormal   Collection Time: 06/16/18 11:52 AM  Result Value Ref Range   Color, Urine YELLOW YELLOW   APPearance CLOUDY (A) CLEAR   Specific Gravity, Urine 1.030 1.005 - 1.030   pH 5.0 5.0 - 8.0   Glucose, UA NEGATIVE NEGATIVE mg/dL   Hgb urine dipstick SMALL (A) NEGATIVE   Bilirubin Urine NEGATIVE NEGATIVE   Ketones, ur NEGATIVE NEGATIVE mg/dL   Protein, ur >=119>=300 (A) NEGATIVE mg/dL   Nitrite NEGATIVE NEGATIVE   Leukocytes, UA NEGATIVE NEGATIVE   RBC / HPF 6-10 0 - 5 RBC/hpf   WBC, UA 6-10 0 - 5 WBC/hpf   Bacteria, UA RARE (A) NONE SEEN   Squamous Epithelial / LPF 11-20 0 - 5   Mucus PRESENT   Protein / creatinine ratio, urine      Status: Abnormal   Collection Time: 06/16/18 11:52 AM  Result Value Ref Range   Creatinine, Urine 301.00 mg/dL   Total Protein, Urine 554 mg/dL   Protein Creatinine Ratio 1.84 (H) 0.00 - 0.15 mg/mg[Cre]  CBC     Status: Abnormal   Collection Time: 06/16/18 12:16 PM  Result Value Ref Range   WBC 10.0 4.0 - 10.5 K/uL   RBC 3.74 (L) 3.87 - 5.11 MIL/uL   Hemoglobin 11.1 (L) 12.0 - 15.0 g/dL   HCT 14.732.5 (L) 82.936.0 - 56.246.0 %   MCV 86.9 78.0 - 100.0 fL   MCH 29.7 26.0 - 34.0 pg   MCHC 34.2 30.0 - 36.0 g/dL   RDW 13.012.9 86.511.5 - 78.415.5 %   Platelets 249 150 - 400 K/uL  Comprehensive metabolic panel     Status: Abnormal   Collection Time: 06/16/18 12:16 PM  Result Value Ref Range   Sodium 133 (L) 135 - 145 mmol/L   Potassium 3.8 3.5 - 5.1 mmol/L   Chloride 105 98 - 111 mmol/L   CO2 20 (L) 22 - 32 mmol/L   Glucose, Bld 78 70 - 99 mg/dL   BUN 17 6 - 20 mg/dL   Creatinine, Ser 6.960.79 0.44 - 1.00 mg/dL   Calcium 8.2 (L) 8.9 - 10.3 mg/dL   Total Protein 5.8 (L) 6.5 -  8.1 g/dL   Albumin 2.5 (L) 3.5 - 5.0 g/dL   AST 25 15 - 41 U/L   ALT 31 0 - 44 U/L   Alkaline Phosphatase 85 38 - 126 U/L   Total Bilirubin 0.6 0.3 - 1.2 mg/dL   GFR calc non Af Amer >60 >60 mL/min   GFR calc Af Amer >60 >60 mL/min   Anion gap 8 5 - 15  Uric acid     Status: None   Collection Time: 06/16/18 12:16 PM  Result Value Ref Range   Uric Acid, Serum 4.7 2.5 - 7.1 mg/dL  Type and screen [facility]     Status: None   Collection Time: 06/16/18  1:32 PM  Result Value Ref Range   ABO/RH(D) O POS    Antibody Screen NEG    Sample Expiration      06/19/2018 Performed at [facility], [address], [place], [place] [address]     Assessment: HD#0  [redacted]w[redacted]d with preeclampsia with severe features Patient with persistent severe range BPs despite 2 rounds of labetalol and 20mg  IV push of  Hydralazine.  Patient with h/o of vaginal delivery x 1 however her cervix is unfavorable for vaginal delivery.    Plan: 1. Discussed case with MFM, Dr. Dewayne Shorter and he recommends immediate delivery 2. Patient is steroid complete 3. Discussed with patient my recommendation to proceed with a primary cesarean section. Patient currently refusing all intervention until her mother arrives.  I explained to her and counseled her extensively about the risk of her having a stroke if we don't intervene with delivery.  She voiced understanding and still wants expectant management.  Will continue to watch her closely. Will start PO labetalol per MFM recommendations.  4. She is currently on magnesium sulfate for seizure prophylaxis.  5. Preeclampsia labs as above show normal kidney function however uric acid is increasing from last draw, Protein creatinine ratio has risen.  LFTs normal and Cr within normal limits.  Platelets also within normal limits.   [name] [name]

## 2018-06-16 NOTE — Progress Notes (Signed)
MD PREOP NOTE  Name: Kelsey Fitzgerald Medical Record Number:  [id number] Date of Birth: 08/20/1991 Date of Service: 06/16/2018  27 y.o. G3P1011 [redacted]w[redacted]d HD#0 with preeclampsia with severe features.   Procedure Planned:  Primary cesarean Delivery  Physical Examination:   Vitals:   06/16/18 1451 06/16/18 1500  BP: (!) 174/94 (!) 157/100  Pulse: 99 89  Resp:    Temp:    SpO2:     General appearance - alert, well appearing, and in no distress and oriented to person, place, and time Mental status - alert, oriented to person, place, and time, normal mood, behavior, speech, dress, motor activity, and thought processes Abd  Soft, gravid, nontender Neuro: 3+ brisk DTRs FHTs  150s, moderate variability accels no decels Toco  No ctx  Cervix: previously performed earlier: long closed posterior  Results for orders placed or performed during the hospital encounter of 06/16/18 (from the past 24 hour(s))  Urinalysis, Routine w reflex microscopic     Status: Abnormal   Collection Time: 06/16/18 11:52 AM  Result Value Ref Range   Color, Urine YELLOW YELLOW   APPearance CLOUDY (A) CLEAR   Specific Gravity, Urine 1.030 1.005 - 1.030   pH 5.0 5.0 - 8.0   Glucose, UA NEGATIVE NEGATIVE mg/dL   Hgb urine dipstick SMALL (A) NEGATIVE   Bilirubin Urine NEGATIVE NEGATIVE   Ketones, ur NEGATIVE NEGATIVE mg/dL   Protein, ur >=010 (A) NEGATIVE mg/dL   Nitrite NEGATIVE NEGATIVE   Leukocytes, UA NEGATIVE NEGATIVE   RBC / HPF 6-10 0 - 5 RBC/hpf   WBC, UA 6-10 0 - 5 WBC/hpf   Bacteria, UA RARE (A) NONE SEEN   Squamous Epithelial / LPF 11-20 0 - 5   Mucus PRESENT   Protein / creatinine ratio, urine     Status: Abnormal   Collection Time: 06/16/18 11:52 AM  Result Value Ref Range   Creatinine, Urine 301.00 mg/dL   Total Protein, Urine 554 mg/dL   Protein Creatinine Ratio 1.84 (H) 0.00 - 0.15 mg/mg[Cre]  CBC     Status: Abnormal   Collection Time: 06/16/18 12:16 PM  Result Value Ref Range   WBC 10.0  4.0 - 10.5 K/uL   RBC 3.74 (L) 3.87 - 5.11 MIL/uL   Hemoglobin 11.1 (L) 12.0 - 15.0 g/dL   HCT 27.2 (L) 53.6 - 64.4 %   MCV 86.9 78.0 - 100.0 fL   MCH 29.7 26.0 - 34.0 pg   MCHC 34.2 30.0 - 36.0 g/dL   RDW 03.4 74.2 - 59.5 %   Platelets 249 150 - 400 K/uL  Comprehensive metabolic panel     Status: Abnormal   Collection Time: 06/16/18 12:16 PM  Result Value Ref Range   Sodium 133 (L) 135 - 145 mmol/L   Potassium 3.8 3.5 - 5.1 mmol/L   Chloride 105 98 - 111 mmol/L   CO2 20 (L) 22 - 32 mmol/L   Glucose, Bld 78 70 - 99 mg/dL   BUN 17 6 - 20 mg/dL   Creatinine, Ser 6.38 0.44 - 1.00 mg/dL   Calcium 8.2 (L) 8.9 - 10.3 mg/dL   Total Protein 5.8 (L) 6.5 - 8.1 g/dL   Albumin 2.5 (L) 3.5 - 5.0 g/dL   AST 25 15 - 41 U/L   ALT 31 0 - 44 U/L   Alkaline Phosphatase 85 38 - 126 U/L   Total Bilirubin 0.6 0.3 - 1.2 mg/dL   GFR calc non Af Amer >60 >60 mL/min  GFR calc Af Amer >60 >60 mL/min   Anion gap 8 5 - 15  Uric acid     Status: None   Collection Time: 06/16/18 12:16 PM  Result Value Ref Range   Uric Acid, Serum 4.7 2.5 - 7.1 mg/dL  Type and screen [facility]     Status: None   Collection Time: 06/16/18  1:32 PM  Result Value Ref Range   ABO/RH(D) O POS    Antibody Screen NEG    Sample Expiration      06/19/2018 Performed at [facility], [address] [place], [place] [id number]     Assessment: HD#0  382w6d with preeclampsia with severe features unable to decrease BP out of severe range despite Multiple doses of antihypertensives.  Patient without neurologic features, however has nausea Discussed risks of surgery with patient to include: hemorrhage, infection, injury to internal organs, possible need For blood transfusion or emergency hysterectomy.  Patient voiced understanding and agrees to proceed. Consent Signed, witnessed and placed into chart.   Plan: 1. On call to OR for primary cesarean delivery 2. 2GM IV Ancef on call to OR 3. Will d/c  magnesium sulfate immediately prior to surgery and re-start after delivery 4. Will have NICU team in OR for delivery  Thereasa Iannello, [facility]AL[facility] [name]

## 2018-06-17 ENCOUNTER — Encounter (HOSPITAL_COMMUNITY): Payer: Self-pay | Admitting: Obstetrics & Gynecology

## 2018-06-17 LAB — CBC WITH DIFFERENTIAL/PLATELET
Basophils Absolute: 0 10*3/uL (ref 0.0–0.1)
Basophils Relative: 0 %
EOS ABS: 0 10*3/uL (ref 0.0–0.7)
Eosinophils Relative: 0 %
HEMATOCRIT: 30 % — AB (ref 36.0–46.0)
HEMOGLOBIN: 10.2 g/dL — AB (ref 12.0–15.0)
LYMPHS ABS: 1.4 10*3/uL (ref 0.7–4.0)
Lymphocytes Relative: 8 %
MCH: 29.6 pg (ref 26.0–34.0)
MCHC: 34 g/dL (ref 30.0–36.0)
MCV: 87 fL (ref 78.0–100.0)
MONO ABS: 0.5 10*3/uL (ref 0.1–1.0)
MONOS PCT: 3 %
Neutro Abs: 16.6 10*3/uL — ABNORMAL HIGH (ref 1.7–7.7)
Neutrophils Relative %: 89 %
Platelets: 279 10*3/uL (ref 150–400)
RBC: 3.45 MIL/uL — ABNORMAL LOW (ref 3.87–5.11)
RDW: 13.1 % (ref 11.5–15.5)
WBC: 18.5 10*3/uL — ABNORMAL HIGH (ref 4.0–10.5)

## 2018-06-17 LAB — PROTEIN / CREATININE RATIO, URINE
Creatinine, Urine: 97 mg/dL
PROTEIN CREATININE RATIO: 0.71 mg/mg{creat} — AB (ref 0.00–0.15)
TOTAL PROTEIN, URINE: 69 mg/dL

## 2018-06-17 LAB — COMPREHENSIVE METABOLIC PANEL
ALBUMIN: 2.4 g/dL — AB (ref 3.5–5.0)
ALT: 29 U/L (ref 0–44)
AST: 33 U/L (ref 15–41)
Alkaline Phosphatase: 80 U/L (ref 38–126)
Anion gap: 10 (ref 5–15)
BUN: 15 mg/dL (ref 6–20)
CHLORIDE: 99 mmol/L (ref 98–111)
CO2: 22 mmol/L (ref 22–32)
CREATININE: 0.8 mg/dL (ref 0.44–1.00)
Calcium: 7.2 mg/dL — ABNORMAL LOW (ref 8.9–10.3)
GFR calc Af Amer: >60 mL/min (ref >60)
GFR calc non Af Amer: >60 mL/min (ref >60)
GLUCOSE: 100 mg/dL — AB (ref 70–99)
Potassium: 4.8 mmol/L (ref 3.5–5.1)
SODIUM: 131 mmol/L — AB (ref 135–145)
Total Bilirubin: 0.2 mg/dL — ABNORMAL LOW (ref 0.3–1.2)
Total Protein: 5.5 g/dL — ABNORMAL LOW (ref 6.5–8.1)

## 2018-06-17 LAB — URIC ACID: URIC ACID, SERUM: 5.2 mg/dL (ref 2.5–7.1)

## 2018-06-17 MED ORDER — CYCLOBENZAPRINE HCL 5 MG PO TABS
5.0000 mg | ORAL_TABLET | Freq: Three times a day (TID) | ORAL | Status: DC | PRN
  Administered 2018-06-18: 5 mg via ORAL
  Filled 2018-06-17 (×2): qty 1

## 2018-06-17 NOTE — Progress Notes (Signed)
Subjective: Postpartum Day 1: Cesarean Delivery Patient reports incisional pain and tolerating PO.  PIH labs repeated, labs stable. PCR improving (1.84 to 0.71) Patient asymptomatic for preeclampsia  Objective: Vitals:   06/17/18 0201 06/17/18 0400 06/17/18 0600 06/17/18 0815  BP: (!) 146/85  140/82 (!) 147/82  Pulse: 71  70 66  Resp: 16  16 18   Temp:    98.7 F (37.1 C)  TempSrc:    Oral  SpO2: 100% 100% 99%   Weight:      Height:        Physical Exam:  General: alert and cooperative Lochia: appropriate Uterine Fundus: firm Incision: healing well, no significant drainage, no dehiscence, no significant erythema DVT Evaluation: No evidence of DVT seen on physical exam. Negative Homan's sign. No cords or calf tenderness. No significant calf/ankle edema.  Recent Labs    06/16/18 1639 06/17/18 0616  HGB 12.1 10.2*  HCT 35.5* 30.0*    Assessment/Plan: Status post Cesarean section. Doing well postoperatively.  Continue current care. Magnesium continuing until 1900, will reassess at that time to determine if patient requires Procardia XL 30mg  in addition to Labetalol 200mg  TID.   Janeece RiggersEllis K Shakeerah Gradel 06/17/2018, 10:06 AM

## 2018-06-17 NOTE — Lactation Note (Signed)
This note was copied from a baby's chart. Lactation Consultation Note  Patient Name: Kelsey Fitzgerald ZOXWR'UToday's Date: 06/17/2018 Reason for consult: Initial assessment;NICU baby Breastfeeding consultation services and Providing Breastmilk For Your Baby In NICU book given to mom.  Pump is set up and I reviewed use.  Mom plans on pumping when she returns from NICU.  Instructed to pump 8-12 times per day followed by hand expression.  Mom does not have a pump at home but [place] WIC in [place].  Referral faxed to office.  Encouraged to call for assist/concerns prn.  Maternal Data Has patient been taught Hand Expression?: Yes  Feeding Feeding Type: Donor Breast Milk  LATCH Score                   Interventions    Lactation Tools Discussed/Used Pump Review: Setup, frequency, and cleaning;Milk Storage Initiated by:: RN Date initiated:: 06/17/18   Consult Status Consult Status: Follow-up Date: 06/18/18 Follow-up type: In-patient    Huston FoleyMOULDEN, Sonam Huelsmann S 06/17/2018, 2:14 PM

## 2018-06-17 NOTE — Anesthesia Postprocedure Evaluation (Signed)
Anesthesia Post Note  Patient: Engineer, [name]  Procedure(s) Performed: CESAREAN SECTION (N/A )     Patient location during evaluation: [facility] Anesthesia Type: Spinal Level of consciousness: awake and alert Pain management: pain level controlled Vital Signs Assessment: post-procedure vital signs reviewed and stable Respiratory status: spontaneous breathing, nonlabored ventilation and respiratory function stable Cardiovascular status: stable Postop Assessment: no headache, no backache, spinal receding, able to ambulate, adequate PO intake, no apparent nausea or vomiting and patient able to bend at knees Anesthetic complications: no    Last Vitals:  Vitals:   06/17/18 0400 06/17/18 0600  BP:  140/82  Pulse:  70  Resp:  16  Temp:    SpO2: 100% 99%    Last Pain:  Vitals:   06/17/18 0600  TempSrc:   PainSc: 0-No pain   Pain Goal:                 [name],[name]

## 2018-06-18 MED ORDER — NIFEDIPINE ER OSMOTIC RELEASE 30 MG PO TB24
30.0000 mg | ORAL_TABLET | Freq: Every day | ORAL | Status: DC
  Administered 2018-06-18: 30 mg via ORAL
  Filled 2018-06-18: qty 1

## 2018-06-18 NOTE — Lactation Note (Signed)
This note was copied from a baby's chart. Lactation Consultation Note  Patient Name: Girl Cammy Brochureakaiya Agostino ZOXWR'UToday's Date: 06/18/2018  NICU baby, 33 weeks 6 days  Per Mom,  she did not use breaspump last night but plans to pump breast this morning. Reinforced pumping DEBP 21/2 -3 hours  within 24 hours.  [facility] [place] helped  mom use the DEBP Per mom, she attempted breastfeed first child but stopped after one week due to lack of support and thinking she did not have enough breast  milk for her baby. LC assisted mom in breast massage and hand expression prior to her using the  DEBP, colostrum was expressed and as LC left room mom had pumped 1.0 ml on right breast. Mom understands NICU guidelines for breast milk storage and collection. Labels were  given for EBM collection. Reviewed LC services: LC Outpatient clinic, LC BF support group and other community resources.  Maternal Data    Feeding Feeding Type: Donor Breast Milk  LATCH Score                   Interventions    Lactation Tools Discussed/Used     Consult Status      Danelle EarthlyRobin Dillan Candela 06/18/2018, 11:12 AM

## 2018-06-18 NOTE — Progress Notes (Addendum)
Subjective: Postpartum Day 2: Cesarean Delivery Patient reports incisional pain, tolerating PO and no problems voiding.  Patient asymptomatic for preeclampsia  Objective: Vitals:   06/17/18 1553 06/17/18 1940 06/18/18 0349 06/18/18 0418  BP: (!) 148/81 (!) 151/88 (!) 161/95 (!) 152/85  Pulse: 66 63 86 80  Resp: 16 17 19 19   Temp: 97.6 F (36.4 C) 98.3 F (36.8 C) 99.1 F (37.3 C)   TempSrc: Oral Oral Oral   SpO2: 99% 99% 100% 100%  Weight:      Height:        Physical Exam:  General: alert and cooperative Lochia: appropriate Uterine Fundus: firm Incision: healing well, no significant drainage, no dehiscence, no significant erythema DVT Evaluation: No evidence of DVT seen on physical exam. Negative Homan's sign. No cords or calf tenderness. No significant calf/ankle edema.  Recent Labs    06/16/18 1639 06/17/18 0616  HGB 12.1 10.2*  HCT 35.5* 30.0*    Assessment/Plan: Status post Cesarean section. Doing well postoperatively.  Continue current care. Added Procardia 30XL to help manage elevated blood pressures, ideally will be able to discontinue Labetalol once stable   Janeece RiggersEllis K Colbi Schiltz 06/18/2018, 8:21 AM   Patient has had continued to have elevated blood pressures in the 160s/80s-90s. Procardia increased to 90mg . Requested reinsertion of IV so patient could receive the IV Hydralazine which was already ordered. The RN tried twice and a CRNA also tried twice but could not place IV. Patient is now refusing IV placement. Discussed elevated blood pressures with patient including the risks of not being able to manage blood pressures in a timely manner with IV medication, including but not limited to the risk for stroke. Patient refused to allow another IV placement attempt despite the risks. PO Hydralazine ordered PRN QID for elevated blood pressures. Dr. Normand Sloopillard consulted.   Janeece Riggersllis K Paulita Licklider 5:42 PM 06/19/18

## 2018-06-19 MED ORDER — HYDRALAZINE HCL 10 MG PO TABS
10.0000 mg | ORAL_TABLET | Freq: Four times a day (QID) | ORAL | Status: DC | PRN
  Administered 2018-06-20: 10 mg via ORAL
  Filled 2018-06-19 (×2): qty 1

## 2018-06-19 MED ORDER — NIFEDIPINE ER OSMOTIC RELEASE 30 MG PO TB24
60.0000 mg | ORAL_TABLET | Freq: Every day | ORAL | Status: DC
  Administered 2018-06-19: 60 mg via ORAL
  Filled 2018-06-19: qty 2

## 2018-06-19 MED ORDER — LABETALOL HCL 5 MG/ML IV SOLN: 10.0000 mg | INTRAVENOUS | Status: DC | PRN

## 2018-06-19 MED ORDER — NIFEDIPINE ER OSMOTIC RELEASE 30 MG PO TB24
30.0000 mg | ORAL_TABLET | Freq: Once | ORAL | Status: AC
  Administered 2018-06-19: 30 mg via ORAL
  Filled 2018-06-19: qty 1

## 2018-06-19 MED ORDER — NIFEDIPINE ER OSMOTIC RELEASE 30 MG PO TB24
90.0000 mg | ORAL_TABLET | Freq: Every day | ORAL | Status: DC
  Administered 2018-06-20 – 2018-06-21 (×2): 90 mg via ORAL
  Filled 2018-06-19 (×3): qty 3

## 2018-06-19 NOTE — Lactation Note (Signed)
This note was copied from a baby's chart. Lactation Consultation Note: RN reports mom is engorged and has not been pumping consistently. Mom pumping as I went into room. Reports she pumped twice during the night. Suggested mom stop pumping and do some good breast massage prior to pumping. Mom did and then milk started flowing much better, Obtained about 2 oz EBM. Mom reports that feels better. Has ice packs at bedside. Encouraged to pump q 2-3 hours to prevent engorgement. [facility] at [facility]. No questions at present.   Patient Name: Kelsey Fitzgerald ZOXWR'UToday's Date: 06/19/2018 Reason for consult: Follow-up assessment;Preterm <34wks   Maternal Data Formula Feeding for Exclusion: No Has patient been taught Hand Expression?: Yes Does the patient have breastfeeding experience prior to this delivery?: Yes  Feeding Feeding Type: Donor Breast Milk  LATCH Score       Type of Nipple: Everted at rest and after stimulation  Comfort (Breast/Nipple): Filling, red/small blisters or bruises, mild/mod discomfort        Interventions Interventions: Breast massage;Hand express;Breast compression  Lactation Tools Discussed/Used Tools: Pump Breast pump type: Double-Electric Breast Pump WIC Program: Yes   Consult Status Consult Status: PRN    Pamelia HoitWeeks, Erhardt Dada D 06/19/2018, 10:25 AM

## 2018-06-19 NOTE — Progress Notes (Signed)
Unsuccessful IV start attempt x 2, once in each hand. CRNA called to assist with obtaining IV access.

## 2018-06-19 NOTE — Progress Notes (Signed)
After 2 unsuccessful attempts by this nurse and CRNA to obtain IV access, patient has refused any further attempts.

## 2018-06-19 NOTE — Progress Notes (Addendum)
Subjective: Postpartum Day 3: Cesarean Delivery Patient reports incisional pain, tolerating PO, + flatus and no problems voiding.  Patient asymptomatic for preeclampsia  Objective: Vitals:   06/18/18 2227 06/18/18 2348 06/19/18 0327 06/19/18 0751  BP: (!) 157/90 116/89 (!) 143/99 (!) 147/83  Pulse: 97 96 (!) 105 99  Resp: 20 16 19 18   Temp: 99.2 F (37.3 C) 99.1 F (37.3 C) 98.8 F (37.1 C) 99.4 F (37.4 C)  TempSrc: Oral Oral Oral Oral  SpO2: 100% 100% 99% 99%  Weight:      Height:        Physical Exam:  General: alert and cooperative Lochia: appropriate Uterine Fundus: firm Incision: healing well, no significant drainage, no dehiscence, no significant erythema DVT Evaluation: No evidence of DVT seen on physical exam. Negative Homan's sign. No cords or calf tenderness. No significant calf/ankle edema.  Recent Labs    06/16/18 1639 06/17/18 0616  HGB 12.1 10.2*  HCT 35.5* 30.0*    Assessment/Plan: Status post Cesarean section. Doing well postoperatively.  Continue current care. Discontinue Labetalol 200 TID, increase Procardia XL to 60mg  QD in preparation for discharge tomorrow  Janeece Riggersllis K Greer 06/19/2018, 8:11 AM   BPs still elevated.  PT is asymptomatic BP (!) 167/93 Comment: RN NOTIFIED  Pulse 95   Temp 98.9 F (37.2 C)   Resp 18   Ht 5' 7.5" (1.715 m)   Wt 85.7 kg (189 lb)   LMP 10/22/2017   SpO2 100%   Breastfeeding? Unknown   BMI 29.16 kg/m  Will give heploc and IV labetalol or hydralazine if needed Increase procardia to 90 xl

## 2018-06-20 MED ORDER — LABETALOL HCL 200 MG PO TABS
200.0000 mg | ORAL_TABLET | Freq: Three times a day (TID) | ORAL | Status: DC
  Administered 2018-06-20 – 2018-06-21 (×4): 200 mg via ORAL
  Filled 2018-06-20 (×4): qty 1

## 2018-06-20 NOTE — Lactation Note (Signed)
This note was copied from a baby's chart. Lactation Consultation Note  Patient Name: Kelsey Fitzgerald ZOXWR'UToday's Date: 06/20/2018   Baby 92 hours old in NICU. [facility]   She states she is now pumping approx 40 ml and using breast compression to increase her supply. Reviewed engorgement care. Reminded her to pump q 2.5-3 hours and take pump part with her for discharge. Mother will call PRN.      Maternal Data    Feeding Feeding Type: Breast Milk  LATCH Score                   Interventions    Lactation Tools Discussed/Used     Consult Status      Hardie PulleyBerkelhammer, Adley Castello Boschen 06/20/2018, 2:14 PM

## 2018-06-20 NOTE — Progress Notes (Signed)
Subjective: Postpartum Day 4: Cesarean Delivery Patient reports incisional pain, tolerating PO, + flatus and no problems voiding.  Patient asymptomatic for preeclampsia  Objective: Vitals:   06/20/18 0514 06/20/18 0916 06/20/18 0933 06/20/18 1046  BP: (!) 153/87 (!) 176/97 (!) 165/85 (!) 151/83  Pulse: 97   (!) 107  Resp: 16 18  18   Temp: 98.3 F (36.8 C)     TempSrc: Oral     SpO2: 100% 99%  100%  Weight:      Height:        Physical Exam:  General: alert and cooperative Lochia: appropriate Uterine Fundus: firm Incision: healing well, no significant drainage, no dehiscence, no significant erythema DVT Evaluation: No evidence of DVT seen on physical exam. Negative Homan's sign. No cords or calf tenderness. No significant calf/ankle edema.  No results for input(s): HGB, HCT in the last 72 hours.  Assessment/Plan: Status post Cesarean section. Doing well postoperatively.  Patient with elevated blood pressures despite 90mg  of Procardia XL, reorder Labetalol 200mg  TID. Plan for discharge tomorrow if pressures are adequately controlled.   Janeece Riggersllis K Joline Encalada 06/20/18 11:27 AM

## 2018-06-21 LAB — CBC
HEMATOCRIT: 26.5 % — AB (ref 36.0–46.0)
HEMOGLOBIN: 8.9 g/dL — AB (ref 12.0–15.0)
MCH: 30.3 pg (ref 26.0–34.0)
MCHC: 33.6 g/dL (ref 30.0–36.0)
MCV: 90.1 fL (ref 78.0–100.0)
Platelets: 337 10*3/uL (ref 150–400)
RBC: 2.94 MIL/uL — ABNORMAL LOW (ref 3.87–5.11)
RDW: 13.8 % (ref 11.5–15.5)
WBC: 11.4 10*3/uL — AB (ref 4.0–10.5)

## 2018-06-21 LAB — COMPREHENSIVE METABOLIC PANEL
ALBUMIN: 3 g/dL — AB (ref 3.5–5.0)
ALT: 53 U/L — AB (ref 0–44)
AST: 42 U/L — AB (ref 15–41)
Alkaline Phosphatase: 79 U/L (ref 38–126)
Anion gap: 11 (ref 5–15)
BILIRUBIN TOTAL: 0.9 mg/dL (ref 0.3–1.2)
BUN: 21 mg/dL — AB (ref 6–20)
CO2: 23 mmol/L (ref 22–32)
CREATININE: 0.86 mg/dL (ref 0.44–1.00)
Calcium: 9 mg/dL (ref 8.9–10.3)
Chloride: 98 mmol/L (ref 98–111)
GFR calc Af Amer: >60 mL/min (ref >60)
GFR calc non Af Amer: >60 mL/min (ref >60)
GLUCOSE: 80 mg/dL (ref 70–99)
POTASSIUM: 4.6 mmol/L (ref 3.5–5.1)
Sodium: 132 mmol/L — ABNORMAL LOW (ref 135–145)
TOTAL PROTEIN: 6.6 g/dL (ref 6.5–8.1)

## 2018-06-21 MED ORDER — NIFEDIPINE ER OSMOTIC RELEASE 90 MG PO TB24: 90.0000 mg | ORAL_TABLET | Freq: Every day | ORAL | 1 refills | Status: DC

## 2018-06-21 MED ORDER — OXYCODONE-ACETAMINOPHEN 5-325 MG PO TABS: 1.0000 | ORAL_TABLET | ORAL | 0 refills | Status: DC | PRN

## 2018-06-21 MED ORDER — LABETALOL HCL 200 MG PO TABS: 200.0000 mg | ORAL_TABLET | Freq: Three times a day (TID) | ORAL | 1 refills | Status: DC

## 2018-06-21 MED ORDER — HYDRALAZINE HCL 10 MG PO TABS: 10.0000 mg | ORAL_TABLET | Freq: Four times a day (QID) | ORAL | 0 refills | Status: DC | PRN

## 2018-06-21 NOTE — Lactation Note (Addendum)
This note was copied from a baby's chart. Lactation Consultation Note  Patient Name: Kelsey Cammy Brochureakaiya Hergert RUEAV'WToday's Date: 06/21/2018 Reason for consult: Follow-up assessment;NICU baby Mom reports being discharged today. Already has pump for home use.[facility]  Assisted mom in getting her parts ready to take home.  Mom reports needs more breastmilk bottles.  Only has 1 left.  Mom reports was able to fill a bottle up last time she pumped.  Mom reports was doing skin to skin in NICU and infant nuzzled at the breast and attempted to latch.  Mom reports she had not planned to breastfeed because her last baby never latched.  Mom reports pumping since infant in NICU. Mom denies questions or concerns at this time.  Mom reports has resources for pumping/breastfeeding for when she gets home.  [facility] Feeding Feeding Type: Donor Breast Milk  LATCH Score                   Interventions    Lactation Tools Discussed/Used WIC Program: Yes   Consult Status Consult Status: Complete Date: 06/21/18 Follow-up type: Call as needed    Kelsey Fitzgerald 06/21/2018, 2:47 PM

## 2018-06-21 NOTE — Lactation Note (Deleted)
This note was copied from a baby's chart. Lactation Consultation Note  Patient Name: Kelsey Cammy Brochureakaiya Pidcock ZHYQM'VToday's Date: 06/21/2018 Reason for consult: Follow-up assessment;NICU baby   Maternal Data    Feeding Feeding Type: Donor Breast Milk  LATCH Score                   Interventions    Lactation Tools Discussed/Used WIC Program: Yes   Consult Status Consult Status: Complete Date: 06/21/18 Follow-up type: Call as needed    [facility] Fitzgerald 06/21/2018, 2:52 PM

## 2018-06-21 NOTE — Progress Notes (Signed)
Discharge teaching complete with pt. Pt understood all information. Pt will leave from NICU to go home.

## 2018-06-21 NOTE — Discharge Instructions (Signed)

## 2018-06-21 NOTE — Discharge Summary (Signed)
OB Discharge Summary     Patient Name: Kelsey Fitzgerald DOB: [birth year] MRN: [id number]  Date of admission: 06/16/2018 Delivering MD: Essie HartPINN, WALDA   Date of discharge: 06/21/2018  Admitting diagnosis: 33WKS, PRE ECLAMPSIA EVAL Intrauterine pregnancy: 5956w6d     Secondary diagnosis:  Active Problems:   Preeclampsia, severe, third trimester  Additional problems:      Discharge diagnosis: Preterm Pregnancy Delivered                                                                                                Post partum procedures:  Augmentation:   Complications: None  Hospital course:  Sceduled C/S   27 y.o. yo [id number] at 7756w6d was admitted to the hospital 06/16/2018 for scheduled cesarean section with the following indication:Severe Preeclampsia MFM rec.  Membrane Rupture Time/Date: 5:22 PM ,06/16/2018   Patient delivered a Viable infant.06/16/2018  Details of operation can be found in separate operative note.  Pateint had an uncomplicated postpartum course.  She is ambulating, tolerating a regular diet, passing flatus, and urinating well. Patient is discharged home in stable condition on  06/21/18         Physical exam  Vitals:   06/20/18 1738 06/20/18 1920 06/21/18 0530 06/21/18 0732  BP: 131/77 139/87 138/75 (!) 148/88  Pulse: 86 98 94 86  Resp: 18 18 19 16   Temp:  99 F (37.2 C) 98.9 F (37.2 C) 98.5 F (36.9 C)  TempSrc:  Oral Oral Oral  SpO2: 100% 100% 100% 100%  Weight:      Height:       General: alert, cooperative and no distress Lochia: appropriate Uterine Fundus: firm Incision: Healing well with no significant drainage DVT Evaluation: No evidence of DVT seen on physical exam. Labs: Lab Results  Component Value Date   WBC 11.4 (H) 06/21/2018   HGB 8.9 (L) 06/21/2018   HCT 26.5 (L) 06/21/2018   MCV 90.1 06/21/2018   PLT 337 06/21/2018   CMP Latest Ref Rng & Units 06/21/2018  Glucose 70 - 99 mg/dL 80  BUN 6 - 20 mg/dL 13(Y21(H)  Creatinine 8.650.44 - 1.00 mg/dL  7.840.86  Sodium 696135 - 295145 mmol/L 132(L)  Potassium 3.5 - 5.1 mmol/L 4.6  Chloride 98 - 111 mmol/L 98  CO2 22 - 32 mmol/L 23  Calcium 8.9 - 10.3 mg/dL 9.0  Total Protein 6.5 - 8.1 g/dL 6.6  Total Bilirubin 0.3 - 1.2 mg/dL 0.9  Alkaline Phos 38 - 126 U/L 79  AST 15 - 41 U/L 42(H)  ALT 0 - 44 U/L 53(H)    Discharge instruction: per After Visit Summary and "Baby and Me Booklet".  After visit meds:  Allergies as of 06/21/2018      Reactions   Ibuprofen Hives   Lactose Intolerance (gi) Nausea And Vomiting      Medication List    TAKE these medications   calcium carbonate 500 MG chewable tablet Commonly known as:  TUMS - dosed in mg elemental calcium Chew 1 tablet by mouth 2 (two) times daily as needed for indigestion or heartburn.   labetalol  200 MG tablet Commonly known as:  NORMODYNE Take 1 tablet (200 mg total) by mouth 3 (three) times daily.   NIFEdipine 90 MG 24 hr tablet Commonly known as:  PROCARDIA XL/ADALAT-CC Take 1 tablet (90 mg total) by mouth daily. Start taking on:  06/22/2018   oxyCODONE-acetaminophen 5-325 MG tablet Commonly known as:  PERCOCET/ROXICET Take 1 tablet by mouth every 4 (four) hours as needed (pain scale 4-7).       Diet: routine diet  Activity: Advance as tolerated. Pelvic rest for 6 weeks.   Outpatient follow up:3 days B/p check in office Follow up Appt:No future appointments. Follow up Visit:No follow-ups on file.  Postpartum contraception: Not Discussed  Newborn Data: Live born female  Birth Weight: 3 lb 14.8 oz (1780 g) APGAR: 8, 8  Newborn Delivery   Birth date/time:  06/16/2018 17:23:00 Delivery type:  C-Section, Low Transverse Trial of labor:  No C-section categorization:  Primary     Baby Feeding: pumping Disposition:NICU   06/21/2018 Rhea Pink, CNM

## 2018-06-29 ENCOUNTER — Other Ambulatory Visit: Payer: Self-pay

## 2018-06-29 ENCOUNTER — Inpatient Hospital Stay (HOSPITAL_COMMUNITY)
Admission: AD | Admit: 2018-06-29 | Discharge: 2018-06-29 | Disposition: A | Discharge status: Home / Self Care | Payer: Medicaid Other | Source: Ambulatory Visit | Attending: Obstetrics & Gynecology | Admitting: Obstetrics & Gynecology

## 2018-06-29 ENCOUNTER — Encounter (HOSPITAL_COMMUNITY): Payer: Self-pay | Admitting: *Deleted

## 2018-06-29 DIAGNOSIS — R300 Dysuria: Secondary | ICD-10-CM | POA: Diagnosis present

## 2018-06-29 DIAGNOSIS — B379 Candidiasis, unspecified: Secondary | ICD-10-CM | POA: Insufficient documentation

## 2018-06-29 DIAGNOSIS — R350 Frequency of micturition: Secondary | ICD-10-CM

## 2018-06-29 DIAGNOSIS — B373 Candidiasis of vulva and vagina: Secondary | ICD-10-CM

## 2018-06-29 DIAGNOSIS — B3731 Acute candidiasis of vulva and vagina: Secondary | ICD-10-CM

## 2018-06-29 LAB — URINALYSIS, ROUTINE W REFLEX MICROSCOPIC
Bacteria, UA: NONE SEEN
Bilirubin Urine: NEGATIVE
Bilirubin Urine: NEGATIVE
Glucose, UA: NEGATIVE mg/dL
Glucose, UA: NEGATIVE mg/dL
Ketones, ur: NEGATIVE mg/dL
Ketones, ur: NEGATIVE mg/dL
Leukocytes, UA: NEGATIVE
NITRITE: NEGATIVE
Nitrite: NEGATIVE
PROTEIN: 100 mg/dL — AB
Protein, ur: >=300 mg/dL — AB
RBC / HPF: >50 RBC/hpf — ABNORMAL HIGH (ref 0–5)
SPECIFIC GRAVITY, URINE: 1.027 (ref 1.005–1.030)
Specific Gravity, Urine: 1.028 (ref 1.005–1.030)
pH: 5 (ref 5.0–8.0)
pH: 5 (ref 5.0–8.0)

## 2018-06-29 MED ORDER — PHENAZOPYRIDINE HCL 95 MG PO TABS: 95.0000 mg | ORAL_TABLET | Freq: Three times a day (TID) | ORAL | 0 refills | Status: AC | PRN

## 2018-06-29 MED ORDER — FLUCONAZOLE 100 MG PO TABS: ORAL_TABLET | ORAL | 0 refills | Status: AC

## 2018-06-29 NOTE — MAU Note (Signed)
Pt presents to MAU c/o pain with urination. Pt states she had a c-section on July 3rd due to pre-eclampsia. Pt states since the catheter was removed she has had pain. Pt states that she thought it was from the catheter but it has just been ongoing since the removal. Pt states she has burning and urgency. No vaginal bleeding. Pt denies any other complications at this time.

## 2018-06-29 NOTE — MAU Provider Note (Signed)
Chief Complaint: Dysuria   None    SUBJECTIVE HPI: Kelsey Fitzgerald is a 27 y.o. [id number] at [redacted]w[redacted]d who presents to Maternity Admissions reporting burning with urination.  Denies fever.  Had LTCS 13 days ago.  Infant still in NICU.  Has not tried any otc.  On Procardia and Labetalol but not taking.  Making her feel dizzy  Location: urethral burning Quality: mild  Duration: during urination Context: before, during and after urination.   Past Medical History:  Diagnosis Date  . Pregnancy induced hypertension    OB History  Gravida Para Term Preterm AB Living  3 2 1 1 1 2   SAB TAB Ectopic Multiple Live Births  1     0 2    # Outcome Date GA Lbr Len/2nd Weight Sex Delivery Anes PTL Lv  3 Preterm 06/16/18 [redacted]w[redacted]d  1.78 kg (3 lb 14.8 oz) F CS-LTranv Spinal  LIV  2 Term              Birth Comments: System Generated. Please review and update pregnancy details.  1 SAB            Past Surgical History:  Procedure Laterality Date  . CESAREAN SECTION N/A 06/16/2018   Procedure: CESAREAN SECTION;  Surgeon: Essie Hart, MD;  [facility]IR[facility]ING SUIT[facility];  Service: Obstetrics;  Laterality: N/A;  . NO PAST SURGERIES     Social History   Socioeconomic History  . Marital status: Single    Spouse name: Not on file  . Number of children: Not on file  . Years of education: Not on file  . Highest education level: Not on file  Occupational History  . Not on file  Social Needs  . Financial resource strain: Not on file  . Food insecurity:    Worry: Not on file    Inability: Not on file  . Transportation needs:    Medical: Not on file    Non-medical: Not on file  Tobacco Use  . Smoking status: Former Games developer  . Smokeless tobacco: Never Used  . Tobacco comment: Stopped 1 year ago  Substance and Sexual Activity  . Alcohol use: Not Currently    Comment: occ.  . Drug use: No  . Sexual activity: Yes    Birth control/protection: None  Lifestyle  . Physical activity:    Days per week: Not on  file    Minutes per session: Not on file  . Stress: Not on file  Relationships  . Social connections:    Talks on phone: Not on file    Gets together: Not on file    Attends religious service: Not on file    Active member of club or organization: Not on file    Attends meetings of clubs or organizations: Not on file    Relationship status: Not on file  . Intimate partner violence:    Fear of current or ex partner: Not on file    Emotionally abused: Not on file    Physically abused: Not on file    Forced sexual activity: Not on file  Other Topics Concern  . Not on file  Social History Narrative  . Not on file   Family History  Problem Relation Age of Onset  . Alcohol abuse Neg Hx   . Arthritis Neg Hx   . Asthma Neg Hx   . Birth defects Neg Hx   . Cancer Neg Hx   . COPD Neg Hx   . Depression Neg  Hx   . Diabetes Neg Hx   . Drug abuse Neg Hx   . Early death Neg Hx   . Hearing loss Neg Hx   . Heart disease Neg Hx   . Hyperlipidemia Neg Hx   . Hypertension Neg Hx   . Kidney disease Neg Hx   . Learning disabilities Neg Hx   . Mental illness Neg Hx   . Mental retardation Neg Hx   . Miscarriages / Stillbirths Neg Hx   . Stroke Neg Hx   . Vision loss Neg Hx    No current facility-administered medications on file prior to encounter.    Current Outpatient Medications on File Prior to Encounter  Medication Sig Dispense Refill  . calcium carbonate (TUMS - DOSED IN MG ELEMENTAL CALCIUM) 500 MG chewable tablet Chew 1 tablet by mouth 2 (two) times daily as needed for indigestion or heartburn.    . labetalol (NORMODYNE) 200 MG tablet Take 1 tablet (200 mg total) by mouth 3 (three) times daily. 90 tablet 1  . NIFEdipine (PROCARDIA XL/ADALAT-CC) 90 MG 24 hr tablet Take 1 tablet (90 mg total) by mouth daily. 30 tablet 1  . oxyCODONE-acetaminophen (PERCOCET/ROXICET) 5-325 MG tablet Take 1 tablet by mouth every 4 (four) hours as needed (pain scale 4-7). 30 tablet 0   Allergies   Allergen Reactions  . Ibuprofen Hives  . Lactose Intolerance (Gi) Nausea And Vomiting    I have reviewed patient's Past Medical Hx, Surgical Hx, Family Hx, Social Hx, medications and allergies.   Review of Systems  Constitutional: Negative.   HENT: Negative.   Eyes: Negative.   Respiratory: Negative.   Cardiovascular: Negative.   Gastrointestinal: Positive for abdominal pain.  Endocrine: Negative.   Genitourinary: Positive for frequency and urgency.  Musculoskeletal: Negative.   Skin: Negative.   Allergic/Immunologic: Negative.   Neurological: Negative.   Hematological: Negative.   Psychiatric/Behavioral: Negative.     OBJECTIVE Patient Vitals for the past 24 hrs:  BP Temp Temp src Pulse Resp Height Weight  06/29/18 0141 (!) 147/85 - - 67 - - -  06/29/18 0121 (!) 146/81 - - 69 - - -  06/29/18 0118 (!) 146/90 - - 73 - - -  06/29/18 0106 (!) 147/76 - - 72 - - -  06/29/18 0038 (!) 162/81 98.4 F (36.9 C) Oral 90 18 5\' 7"  (1.702 m) 72.8 kg (160 lb 6.4 oz)   Constitutional: Well-developed, well-nourished female in no acute distress.  Cardiovascular: normal rate Respiratory: normal rate and effort.  GI: Abd soft, non-tender, gravid appropriate for gestational age. Pos BS x 4  Incision healing, no sign redness or bruising noted MS: Extremities nontender, no edema, normal ROM Neurologic: Alert and oriented x 4.  GU: Neg CVAT.  SPECULUM EXAM: NEFG, physiologic discharge, no blood noted, cervix clean  BIMANUAL: cervix closed; uterus 3 below umbilicus , no adnexal tenderness or masses.  No CMT.  LAB RESULTS Results for orders placed or performed during the hospital encounter of 06/29/18 (from the past 24 hour(s))  Urinalysis, Routine w reflex microscopic     Status: Abnormal   Collection Time: 06/29/18 12:48 AM  Result Value Ref Range   Color, Urine YELLOW YELLOW   APPearance HAZY (A) CLEAR   Specific Gravity, Urine 1.027 1.005 - 1.030   pH 5.0 5.0 - 8.0   Glucose, UA  NEGATIVE NEGATIVE mg/dL   Hgb urine dipstick LARGE (A) NEGATIVE   Bilirubin Urine NEGATIVE NEGATIVE   Ketones, ur NEGATIVE  NEGATIVE mg/dL   Protein, ur >=161 (A) NEGATIVE mg/dL   Nitrite NEGATIVE NEGATIVE   Leukocytes, UA SMALL (A) NEGATIVE   RBC / HPF >50 (H) 0 - 5 RBC/hpf   WBC, UA 6-10 0 - 5 WBC/hpf   Bacteria, UA RARE (A) NONE SEEN   Squamous Epithelial / LPF 0-5 0 - 5   Mucus PRESENT     IMAGING No results found.  MAU COURSE Orders Placed This Encounter  Procedures  . OB Urine Culture  . Urinalysis, Routine w reflex microscopic  . Urinalysis, Routine w reflex microscopic  . In and Out Cath  . Discharge patient Discharge disposition: 01-Home or Self Care; Discharge patient date: 06/29/2018   Meds ordered this encounter  Medications  . phenazopyridine (PYRIDIUM) 95 MG tablet    Sig: Take 1 tablet (95 mg total) by mouth 3 (three) times daily as needed for pain.    Dispense:  10 tablet    Refill:  0  . fluconazole (DIFLUCAN) 100 MG tablet    Sig: 150mg  po day one repeat one tablet day 3    Dispense:  2 tablet    Refill:  0   Wet mount positive yeast, negative clue, negative trich MDM UA repeat to do cath specimen, wet mount positive for yeast, BP reviewed ASSESSMENT Yeast infection postpartum Urinary burning  PLAN Treat with Diflucan for yeast. Azo encouraged and increase fluids Will send UA culture. Discharge home in stable condition. Take BP medication Procardia 30 mg XL  Allergies as of 06/29/2018      Reactions   Ibuprofen Hives   Lactose Intolerance (gi) Nausea And Vomiting      Medication List    STOP taking these medications   labetalol 200 MG tablet Commonly known as:  NORMODYNE     TAKE these medications   calcium carbonate 500 MG chewable tablet Commonly known as:  TUMS - dosed in mg elemental calcium Chew 1 tablet by mouth 2 (two) times daily as needed for indigestion or heartburn.   fluconazole 100 MG tablet Commonly known as:   DIFLUCAN 150mg  po day one repeat one tablet day 3   NIFEdipine 90 MG 24 hr tablet Commonly known as:  PROCARDIA XL/ADALAT-CC Take 1 tablet (90 mg total) by mouth daily.   oxyCODONE-acetaminophen 5-325 MG tablet Commonly known as:  PERCOCET/ROXICET Take 1 tablet by mouth every 4 (four) hours as needed (pain scale 4-7).   phenazopyridine 95 MG tablet Commonly known as:  PYRIDIUM Take 1 tablet (95 mg total) by mouth 3 (three) times daily as needed for pain.        Kenney Houseman, CNM 06/29/2018  2:33 AM

## 2018-06-29 NOTE — Discharge Instructions (Signed)
Probiotics °What are probiotics? °Probiotics are the good bacteria and yeasts that live in your body and keep you and your digestive system healthy. Probiotics also help your body's defense (immune) system and protect your body against bad bacterial growth. °Certain foods contain probiotics, such as yogurt. Probiotics can also be purchased as a supplement. As with any supplement or drug, it is important to discuss its use with your health care provider. °What affects the balance of bacteria in my body? °The balance of bacteria in your body can be affected by: °· Antibiotic medicines. Antibiotics are sometimes necessary to treat infection. Unfortunately, they may kill good or friendly bacteria in your body as well as the bad bacteria. This may lead to stomach problems like diarrhea, gas, and cramping. °· Disease. Some conditions are the result of an overgrowth of bad bacteria, yeasts, parasites, or fungi. These conditions include: °? Infectious diarrhea. °? Stomach and respiratory infections. °? Skin infections. °? Irritable bowel syndrome (IBS). °? Inflammatory bowel diseases. °? Ulcer due to Helicobacter pylori (H. pylori) infection. °? Tooth decay and periodontal disease. °? Vaginal infections. ° °Stress and poor diet may also lower the good bacteria in your body. °What type of probiotic is right for me? °Probiotics are available over the counter at your local pharmacy, health food, or grocery store. They come in many different forms, combinations of strains, and dosing strengths. Some may need to be refrigerated. Always read the label for storage and usage instructions. °Specific strains have been shown to be more effective for certain conditions. Ask your health care provider what option is best for you. °Why would I need probiotics? °There are many reasons your health care provider might recommend a probiotic supplement, including: °· Diarrhea. °· Constipation. °· IBS. °· Respiratory infections. °· Yeast  infections. °· Acne, eczema, and other skin conditions. °· Frequent urinary tract infections (UTIs). ° °Are there side effects of probiotics? °Some people experience mild side effects when taking probiotics. Side effects are usually temporary and may include: °· Gas. °· Bloating. °· Cramping. ° °Rarely, serious side effects, such as infection or immune system changes, may occur. °What else do I need to know about probiotics? °· There are many different strains of probiotics. Certain strains may be more effective depending on your condition. Probiotics are available in varying doses. Ask your health care provider which probiotic you should use and how often. °· If you are taking probiotics along with antibiotics, it is generally recommended to wait at least 2 hours between taking the antibiotic and taking the probiotic. °For more information: °National Center for Complementary and Alternative Medicine http://nccam.nih.gov/ °This information is not intended to replace advice given to you by your health care provider. Make sure you discuss any questions you have with your health care provider. °Document Released: 06/28/2014 Document Revised: 10/28/2016 Document Reviewed: 02/28/2014 °Elsevier Interactive Patient Education © 2017 [employer] Inc[employer] ° ° °Vaginal Yeast infection, Adult °Vaginal yeast infection is a condition that causes soreness, swelling, and redness (inflammation) of the vagina. It also causes vaginal discharge. This is a common condition. Some women get this infection frequently. °What are the causes? °This condition is caused by a change in the normal balance of the yeast (candida) and bacteria that live in the vagina. This change causes an overgrowth of yeast, which causes the inflammation. °What increases the risk? °This condition is more likely to develop in: °· Women who take antibiotic medicines. °· Women who have diabetes. °· Women who take birth control pills. °·   Women who are pregnant. °· Women who  douche often. °· Women who have a weak defense (immune) system. °· Women who have been taking steroid medicines for a long time. °· Women who frequently wear tight clothing. ° °What are the signs or symptoms? °Symptoms of this condition include: °· White, thick vaginal discharge. °· Swelling, itching, redness, and irritation of the vagina. The lips of the vagina (vulva) may be affected as well. °· Pain or a burning feeling while urinating. °· Pain during sex. ° °How is this diagnosed? °This condition is diagnosed with a medical history and physical exam. This will include a pelvic exam. Your health care provider will examine a sample of your vaginal discharge under a microscope. Your health care provider may send this sample for testing to confirm the diagnosis. °How is this treated? °This condition is treated with medicine. Medicines may be over-the-counter or prescription. You may be told to use one or more of the following: °· Medicine that is taken orally. °· Medicine that is applied as a cream. °· Medicine that is inserted directly into the vagina (suppository). ° °Follow these instructions at home: °· Take or apply over-the-counter and prescription medicines only as told by your health care provider. °· Do not have sex until your health care provider has approved. Tell your sex partner that you have a yeast infection. That person should go to his or her health care provider if he or she develops symptoms. °· Do not wear tight clothes, such as pantyhose or tight pants. °· Avoid using tampons until your health care provider approves. °· Eat more yogurt. This may help to keep your yeast infection from returning. °· Try taking a sitz bath to help with discomfort. This is a warm water bath that is taken while you are sitting down. The water should only come up to your hips and should cover your buttocks. Do this 3-4 times per day or as told by your health care provider. °· Do not douche. °· Wear breathable, cotton  underwear. °· If you have diabetes, keep your blood sugar levels under control. °Contact a health care provider if: °· You have a fever. °· Your symptoms go away and then return. °· Your symptoms do not get better with treatment. °· Your symptoms get worse. °· You have new symptoms. °· You develop blisters in or around your vagina. °· You have blood coming from your vagina and it is not your menstrual period. °· You develop pain in your abdomen. °This information is not intended to replace advice given to you by your health care provider. Make sure you discuss any questions you have with your health care provider. °Document Released: 09/10/2005 Document Revised: 05/14/2016 Document Reviewed: 06/04/2015 °Elsevier Interactive Patient Education © 2018 Elsevier Inc. ° °

## 2018-06-30 LAB — CULTURE, OB URINE: CULTURE: NO GROWTH

## 2018-07-08 ENCOUNTER — Inpatient Hospital Stay (HOSPITAL_COMMUNITY): Admission: RE | Admit: 2018-07-08 | Payer: Medicaid Other | Source: Ambulatory Visit

## 2023-07-08 ENCOUNTER — Encounter (HOSPITAL_COMMUNITY): Payer: Self-pay | Admitting: Emergency Medicine

## 2023-07-08 ENCOUNTER — Ambulatory Visit (HOSPITAL_COMMUNITY)
Admission: EM | Admit: 2023-07-08 | Discharge: 2023-07-08 | Disposition: A | Discharge status: Home / Self Care | Payer: Medicaid Other | Attending: Physician Assistant | Admitting: Physician Assistant

## 2023-07-08 DIAGNOSIS — R21 Rash and other nonspecific skin eruption: Secondary | ICD-10-CM | POA: Diagnosis not present

## 2023-07-08 MED ORDER — TRIAMCINOLONE ACETONIDE 0.025 % EX CREA: 1.0000 | TOPICAL_CREAM | Freq: Two times a day (BID) | CUTANEOUS | 0 refills | Status: AC

## 2023-07-08 NOTE — ED Triage Notes (Signed)
Pt reports for about 3 weeks having rash on face that itches. Reports when cleaned face this morning it burned.

## 2023-07-08 NOTE — ED Provider Notes (Signed)
[facility]    CSN: [id number] Arrival date & time: 07/08/23  1526      History   Chief Complaint Chief Complaint  Patient presents with   Rash    HPI Kelsey Fitzgerald is a 32 y.o. female.   HPI   RASH Duration:  weeks  Location: face  Itching: yes- intermittent  Burning: yes- currently  Redness: yes Oozing: no Scaling:  She reports skin sometimes feels hard and will soften after washing  Blisters: no Painful: no Fevers: no Change in detergents/soaps/personal care products: no not prior to rash starting  Denies new medications Recent illness: no Recent travel:no History of same: no Context: fluctuating thought it was getting better until she washed her face this AM  Alleviating factors: nothing Treatments attempted: has tried alcohol wipes, cleansing wipes, cortisone 10 cream, another cream but she is not sure what it is  Shortness of breath: no  Throat/tongue swelling: no Myalgias/arthralgias: no      Past Medical History:  Diagnosis Date   Pregnancy induced hypertension     Patient Active Problem List   Diagnosis Date Noted   Preeclampsia, severe, third trimester 06/16/2018    Past Surgical History:  Procedure Laterality Date   CESAREAN SECTION N/A 06/16/2018   Procedure: CESAREAN SECTION;  Surgeon: Essie Hart, MD;  Location: [facility];  Service: Obstetrics;  Laterality: N/A;   NO PAST SURGERIES      OB History     Gravida  3   Para  2   Term  1   Preterm  1   AB  1   Living  2      SAB  1   IAB      Ectopic      Multiple  0   Live Births  2            Home Medications    Prior to Admission medications   Medication Sig Start Date End Date Taking? Authorizing Provider  triamcinolone (KENALOG) 0.025 % cream Apply 1 Application topically 2 (two) times daily. Do not use for more than 2 weeks at a time 07/08/23  Yes Jennalee Greaves E, PA-C  calcium carbonate (TUMS - DOSED IN MG ELEMENTAL CALCIUM) 500 MG  chewable tablet Chew 1 tablet by mouth 2 (two) times daily as needed for indigestion or heartburn.    [provider]  fluconazole (DIFLUCAN) 100 MG tablet 150mg  po day one repeat one tablet day 3 06/29/18   Prothero, Henderson Newcomer, CNM  NIFEdipine (PROCARDIA XL/ADALAT-CC) 90 MG 24 hr tablet Take 1 tablet (90 mg total) by mouth daily. 06/22/18   Clemmons, Elmore Guise, CNM  oxyCODONE-acetaminophen (PERCOCET/ROXICET) 5-325 MG tablet Take 1 tablet by mouth every 4 (four) hours as needed (pain scale 4-7). 06/21/18   Clemmons, Elmore Guise, CNM  phenazopyridine (PYRIDIUM) 95 MG tablet Take 1 tablet (95 mg total) by mouth 3 (three) times daily as needed for pain. 06/29/18   Prothero, Henderson Newcomer, CNM    Family History Family History  Problem Relation Age of Onset   Alcohol abuse Neg Hx    Arthritis Neg Hx    Asthma Neg Hx    Birth defects Neg Hx    Cancer Neg Hx    COPD Neg Hx    Depression Neg Hx    Diabetes Neg Hx    Drug abuse Neg Hx    Early death Neg Hx    Hearing loss Neg Hx  Heart disease Neg Hx    Hyperlipidemia Neg Hx    Hypertension Neg Hx    Kidney disease Neg Hx    Learning disabilities Neg Hx    Mental illness Neg Hx    Mental retardation Neg Hx    Miscarriages / Stillbirths Neg Hx    Stroke Neg Hx    Vision loss Neg Hx     Social History Social History   Tobacco Use   Smoking status: Former   Smokeless tobacco: Never   Tobacco comments:    Stopped 1 year ago  Vaping Use   Vaping status: Never Used  Substance Use Topics   Alcohol use: Not Currently    Comment: occ.   Drug use: No     Allergies   Ibuprofen and Lactose intolerance (gi)   Review of Systems Review of Systems  Skin:  Positive for rash.     Physical Exam Triage Vital Signs ED Triage Vitals  Encounter Vitals Group     BP 07/08/23 1622 117/75     Systolic BP Percentile --      Diastolic BP Percentile --      Pulse Rate 07/08/23 1622 64     Resp 07/08/23 1622 14     Temp 07/08/23 1622 98.9 F  (37.2 C)     Temp Source 07/08/23 1622 Oral     SpO2 07/08/23 1622 96 %     Weight --      Height --      Head Circumference --      Peak Flow --      Pain Score 07/08/23 1621 0     Pain Loc --      Pain Education --      Exclude from Growth Chart --    No data found.  Updated Vital Signs BP 117/75 (BP Location: Left Arm)   Pulse 64   Temp 98.9 F (37.2 C) (Oral)   Resp 14   LMP 06/12/2023   SpO2 96%   Visual Acuity Right Eye Distance:   Left Eye Distance:   Bilateral Distance:    Right Eye Near:   Left Eye Near:    Bilateral Near:     Physical Exam Vitals reviewed.  Constitutional:      General: She is awake.     Appearance: Normal appearance. She is well-developed and well-groomed.  HENT:     Head: Normocephalic and atraumatic.     Mouth/Throat:     Lips: Pink. No lesions.     Mouth: Mucous membranes are moist. No angioedema.  Eyes:     General: Lids are normal. Gaze aligned appropriately.     Conjunctiva/sclera: Conjunctivae normal.     Pupils: Pupils are equal, round, and reactive to light.  Pulmonary:     Effort: Pulmonary effort is normal.  Musculoskeletal:     Cervical back: Normal range of motion.  Skin:    Findings: Rash present. Rash is macular.     Comments: Erythematous, macular rash along both sides of the face, cheeks and around mouth.  Neurological:     Mental Status: She is alert.  Psychiatric:        Behavior: Behavior is cooperative.      UC Treatments / Results  Labs (all labs ordered are listed, but only abnormal results are displayed) Labs Reviewed - No data to display  EKG   Radiology No results found.  Procedures Procedures (including critical care time)  Medications Ordered in  UC Medications - No data to display  Initial Impression / Assessment and Plan / UC Course  I have reviewed the triage vital signs and the nursing notes.  Pertinent labs & imaging results that were available during my care of the patient  were reviewed by me and considered in my medical decision making (see chart for details).       Final Clinical Impressions(s) / UC Diagnoses     Rash and nonspecific skin eruption  At this time erythematous, macular  rash is apparent over cheeks and surrounding mouth, lips. No obvious signs of angioedema, swelling, involvement of eyes or oral cavity. Reviewed care measures such as using gentle cleanser, gentle moisturizer, refraining from using make-up, using sunscreen.  Will also send with a low potency steroid, Kenalog cream with recommendation not to use for longer than 2 weeks. ED and return precautions discussed during encounter.  Final diagnoses:  Rash and nonspecific skin eruption     Discharge Instructions      At this time I recommend the following: Using a gentle cleanser such as Cetaphil facial wash and Cetaphil moisturizer to avoid further irritation.  Please do not continue to use harsh cleansers or alcohol-based products until your rash is completely gone. You can use the steroid cream that I have provided as needed but do not use for more than 2 weeks as this can cause skin thinning and other irritation.     ED Prescriptions     Medication Sig Dispense Auth. Provider   triamcinolone (KENALOG) 0.025 % cream Apply 1 Application topically 2 (two) times daily. Do not use for more than 2 weeks at a time 30 g Chasin Findling E, PA-C      PDMP not reviewed this encounter.   Providence Crosby, PA-C 07/08/23 1651

## 2023-07-08 NOTE — Discharge Instructions (Addendum)
At this time I recommend the following: Using a gentle cleanser such as Cetaphil facial wash and Cetaphil moisturizer to avoid further irritation.  Please do not continue to use harsh cleansers or alcohol-based products until your rash is completely gone. You can use the steroid cream that I have provided as needed but do not use for more than 2 weeks as this can cause skin thinning and other irritation.

## 2024-05-03 LAB — HM PAP SMEAR

## 2024-06-08 ENCOUNTER — Encounter: Payer: Self-pay | Admitting: Obstetrics and Gynecology

## 2024-06-08 ENCOUNTER — Ambulatory Visit (INDEPENDENT_AMBULATORY_CARE_PROVIDER_SITE_OTHER): Admitting: Obstetrics and Gynecology

## 2024-06-08 VITALS — BP 129/74 | HR 84 | Ht 68.0 in | Wt 175.8 lb

## 2024-06-08 DIAGNOSIS — Z3041 Encounter for surveillance of contraceptive pills: Secondary | ICD-10-CM | POA: Diagnosis not present

## 2024-06-08 DIAGNOSIS — Z3202 Encounter for pregnancy test, result negative: Secondary | ICD-10-CM

## 2024-06-08 MED ORDER — NORETHIN ACE-ETH ESTRAD-FE 1-20 MG-MCG PO TABS: 1.0000 | ORAL_TABLET | Freq: Every day | ORAL | 11 refills | Status: AC

## 2024-06-08 NOTE — Progress Notes (Signed)
 Pt presents for Nexplanon removal. Nexplanon placed 07/2018 Considering BC pills.   Last PAP 2025

## 2024-06-08 NOTE — Progress Notes (Signed)
     GYNECOLOGY OFFICE PROCEDURE NOTE  Kelsey Fitzgerald is a 33 y.o. [id number] here for Nexplanon removal Last pap smear was on 2025 and was abnormal.  No other gynecologic concerns.  Nexplanon Removal Patient identified, informed consent performed, consent signed.   Appropriate time out taken.   Nexplanon site identified.  Area prepped in usual sterile fashon. One ml of 1% lidocaine was used to anesthetize the area at the distal end of the implant. A small stab incision was made right beside the implant on the distal portion.  The Nexplanon rod was grasped using hemostats and removed without difficulty.  There was minimal blood loss. There were no complications.  3 ml of 1% lidocaine was injected around the incision for post-procedure analgesia.  Steri-strips were applied over the small incision.  A pressure bandage was applied to reduce any bruising.    The patient tolerated the procedure well and was given post procedure instructions.  Patient is planning to use pills for contraception.   Nidia Daring, FNP

## 2024-06-09 LAB — POCT URINE PREGNANCY: Preg Test, Ur: NEGATIVE

## 2024-06-12 ENCOUNTER — Encounter: Payer: Self-pay | Admitting: Obstetrics and Gynecology

## 2024-07-08 ENCOUNTER — Encounter: Admitting: Obstetrics and Gynecology

## 2024-08-01 ENCOUNTER — Other Ambulatory Visit (HOSPITAL_COMMUNITY)
Admission: RE | Admit: 2024-08-01 | Discharge: 2024-08-01 | Disposition: A | Discharge status: Home / Self Care | Source: Ambulatory Visit | Attending: Obstetrics and Gynecology | Admitting: Obstetrics and Gynecology

## 2024-08-01 ENCOUNTER — Encounter: Payer: Self-pay | Admitting: Obstetrics and Gynecology

## 2024-08-01 ENCOUNTER — Ambulatory Visit (INDEPENDENT_AMBULATORY_CARE_PROVIDER_SITE_OTHER): Admitting: Obstetrics and Gynecology

## 2024-08-01 VITALS — BP 131/78 | HR 59 | Wt 172.0 lb

## 2024-08-01 DIAGNOSIS — R87610 Atypical squamous cells of undetermined significance on cytologic smear of cervix (ASC-US): Secondary | ICD-10-CM

## 2024-08-01 DIAGNOSIS — R87810 Cervical high risk human papillomavirus (HPV) DNA test positive: Secondary | ICD-10-CM | POA: Diagnosis not present

## 2024-08-01 NOTE — Progress Notes (Signed)
 33 yo P2 with abnormal pap smear here for colposcopy. 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  Past Medical History:  Diagnosis Date   Pregnancy induced hypertension    Past Surgical History:  Procedure Laterality Date   CESAREAN SECTION N/A 06/16/2018   Procedure: CESAREAN SECTION;  Surgeon: Bettina Muskrat, MD;  Location: [facility]TH[facility] SU[facility];  Service: Obstetrics;  Laterality: N/A;   NO PAST SURGERIES     Family History  Problem Relation Age of Onset   Cancer Mother    Alcohol abuse Neg Hx    Arthritis Neg Hx    Asthma Neg Hx    Birth defects Neg Hx    COPD Neg Hx    Depression Neg Hx    Diabetes Neg Hx    Drug abuse Neg Hx    Early death Neg Hx    Hearing loss Neg Hx    Heart disease Neg Hx    Hyperlipidemia Neg Hx    Hypertension Neg Hx    Kidney disease Neg Hx    Learning disabilities Neg Hx    Mental illness Neg Hx    Mental retardation Neg Hx    Miscarriages / Stillbirths Neg Hx    Stroke Neg Hx    Vision loss Neg Hx    Social History   Tobacco Use   Smoking status: Former   Smokeless tobacco: Never   Tobacco comments:    Stopped 1 year ago  Vaping Use   Vaping status: Never Used  Substance Use Topics   Alcohol use: Not Currently    Comment: occ.   Drug use: No   ROS See pertinent in HPI. All other systems reviewed and non contributory Blood pressure 131/78, pulse (!) 59, weight 172 lb (78 kg), last menstrual period 07/23/2024, unknown if currently breastfeeding.  GENERAL: Well-developed, well-nourished female in no acute distress.  ABDOMEN: Soft, nontender, nondistended. No organomegaly. PELVIC: Normal external female genitalia. Vagina is pink and rugated.  Normal discharge. Normal appearing cervix. Chaperone present during the pelvic exam EXTREMITIES: No cyanosis, clubbing, or edema, 2+ distal pulses.  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  Placed in lithotomy  position. Cervix viewed with speculum and colposcope after application of acetic acid.   Colposcopy adequate?  yes Acetowhite lesions? 4-8 o'clock Punctation?no Mosaicism?  no Abnormal vasculature?  no Biopsies?yes 4 and 8 o'clock ECC?yes  COMMENTS: Patient was given post procedure instructions.  She will be contacted with results and management plan as indicated Discussed benefits of Gardasil vaccine  Winton Felt, MD

## 2024-08-03 LAB — SURGICAL PATHOLOGY

## 2024-08-04 ENCOUNTER — Ambulatory Visit: Payer: Self-pay | Admitting: Obstetrics and Gynecology
